# Patient Record
Sex: Female | Born: 1975 | Race: Black or African American | Hispanic: No | Marital: Single | Smoking: Former smoker
Health system: Southern US, Community
[De-identification: ages and names within clinical notes are randomized; demographics above are authoritative.]

## PROBLEM LIST (undated history)

## (undated) DIAGNOSIS — J40 Bronchitis, not specified as acute or chronic: Secondary | ICD-10-CM

## (undated) HISTORY — PX: TUBAL LIGATION: SHX77

---

## 2003-07-19 ENCOUNTER — Emergency Department (HOSPITAL_COMMUNITY): Admission: EM | Admit: 2003-07-19 | Discharge: 2003-07-19 | Payer: Self-pay | Admitting: Emergency Medicine

## 2003-08-08 ENCOUNTER — Ambulatory Visit (HOSPITAL_COMMUNITY): Admission: AD | Admit: 2003-08-08 | Discharge: 2003-08-09 | Payer: Self-pay | Admitting: Obstetrics and Gynecology

## 2003-08-09 ENCOUNTER — Encounter (INDEPENDENT_AMBULATORY_CARE_PROVIDER_SITE_OTHER): Payer: Self-pay | Admitting: *Deleted

## 2003-09-07 ENCOUNTER — Inpatient Hospital Stay (HOSPITAL_COMMUNITY): Admission: AD | Admit: 2003-09-07 | Discharge: 2003-09-07 | Payer: Self-pay | Admitting: Family Medicine

## 2004-04-16 ENCOUNTER — Ambulatory Visit (HOSPITAL_COMMUNITY): Admission: RE | Admit: 2004-04-16 | Discharge: 2004-04-16 | Payer: Self-pay | Admitting: *Deleted

## 2004-04-16 ENCOUNTER — Ambulatory Visit: Payer: Self-pay | Admitting: *Deleted

## 2004-05-06 ENCOUNTER — Ambulatory Visit (HOSPITAL_COMMUNITY): Admission: RE | Admit: 2004-05-06 | Discharge: 2004-05-06 | Payer: Self-pay | Admitting: *Deleted

## 2004-06-26 ENCOUNTER — Inpatient Hospital Stay (HOSPITAL_COMMUNITY): Admission: AD | Admit: 2004-06-26 | Discharge: 2004-06-26 | Payer: Self-pay | Admitting: *Deleted

## 2004-09-15 ENCOUNTER — Inpatient Hospital Stay (HOSPITAL_COMMUNITY): Admission: AD | Admit: 2004-09-15 | Discharge: 2004-09-15 | Payer: Self-pay

## 2004-10-02 ENCOUNTER — Encounter (INDEPENDENT_AMBULATORY_CARE_PROVIDER_SITE_OTHER): Payer: Self-pay | Admitting: *Deleted

## 2004-10-02 ENCOUNTER — Inpatient Hospital Stay (HOSPITAL_COMMUNITY): Admission: AD | Admit: 2004-10-02 | Discharge: 2004-10-04 | Payer: Self-pay | Admitting: Obstetrics and Gynecology

## 2004-10-02 ENCOUNTER — Ambulatory Visit: Payer: Self-pay | Admitting: Obstetrics & Gynecology

## 2005-01-09 ENCOUNTER — Emergency Department (HOSPITAL_COMMUNITY): Admission: EM | Admit: 2005-01-09 | Discharge: 2005-01-09 | Payer: Self-pay | Admitting: Family Medicine

## 2005-02-09 ENCOUNTER — Emergency Department (HOSPITAL_COMMUNITY): Admission: EM | Admit: 2005-02-09 | Discharge: 2005-02-09 | Payer: Self-pay | Admitting: Family Medicine

## 2005-03-23 ENCOUNTER — Inpatient Hospital Stay (HOSPITAL_COMMUNITY): Admission: AD | Admit: 2005-03-23 | Discharge: 2005-03-23 | Payer: Self-pay | Admitting: Family Medicine

## 2005-04-29 ENCOUNTER — Ambulatory Visit (HOSPITAL_COMMUNITY): Admission: RE | Admit: 2005-04-29 | Discharge: 2005-04-29 | Payer: Self-pay | Admitting: *Deleted

## 2005-07-29 ENCOUNTER — Ambulatory Visit: Payer: Self-pay | Admitting: *Deleted

## 2005-07-29 ENCOUNTER — Inpatient Hospital Stay (HOSPITAL_COMMUNITY): Admission: AD | Admit: 2005-07-29 | Discharge: 2005-07-30 | Payer: Self-pay | Admitting: Obstetrics and Gynecology

## 2005-08-08 ENCOUNTER — Ambulatory Visit: Payer: Self-pay | Admitting: *Deleted

## 2005-08-08 ENCOUNTER — Inpatient Hospital Stay (HOSPITAL_COMMUNITY): Admission: AD | Admit: 2005-08-08 | Discharge: 2005-08-10 | Payer: Self-pay | Admitting: *Deleted

## 2005-08-09 ENCOUNTER — Encounter (INDEPENDENT_AMBULATORY_CARE_PROVIDER_SITE_OTHER): Payer: Self-pay | Admitting: *Deleted

## 2005-11-19 ENCOUNTER — Emergency Department (HOSPITAL_COMMUNITY): Admission: EM | Admit: 2005-11-19 | Discharge: 2005-11-20 | Payer: Self-pay | Admitting: Emergency Medicine

## 2005-12-16 ENCOUNTER — Emergency Department (HOSPITAL_COMMUNITY): Admission: EM | Admit: 2005-12-16 | Discharge: 2005-12-16 | Payer: Self-pay | Admitting: Family Medicine

## 2006-01-14 ENCOUNTER — Ambulatory Visit: Payer: Self-pay | Admitting: Obstetrics and Gynecology

## 2006-01-19 ENCOUNTER — Ambulatory Visit (HOSPITAL_COMMUNITY): Admission: RE | Admit: 2006-01-19 | Discharge: 2006-01-19 | Payer: Self-pay | Admitting: Obstetrics and Gynecology

## 2006-04-17 IMAGING — US US OB COMP +14 WK
1 series · 13 of 28 positions shown · non-contrast
Comparison: none

CLINICAL DATA: LMP 01/11/04.  G7 P4 AB2.  Assess fetal anatomy and gestational age.

[Series 1: us ob comp +14 wk · 0.24mm/px · 13 of 58 slices shown]
[im 3/58]
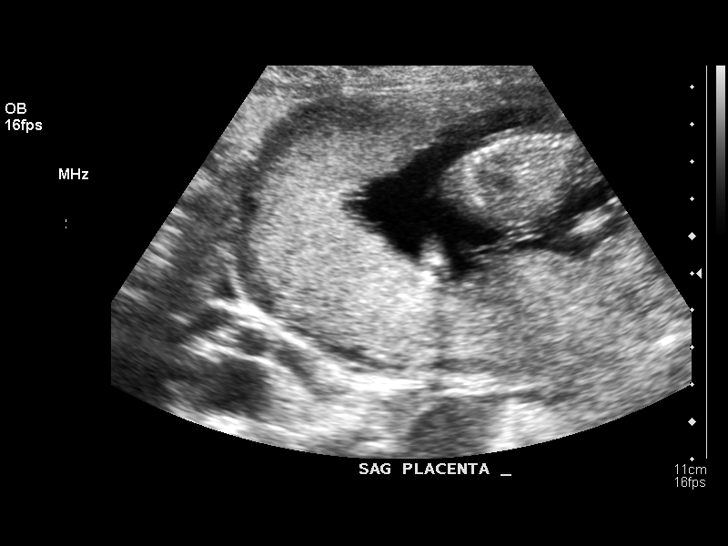
[im 7/58]
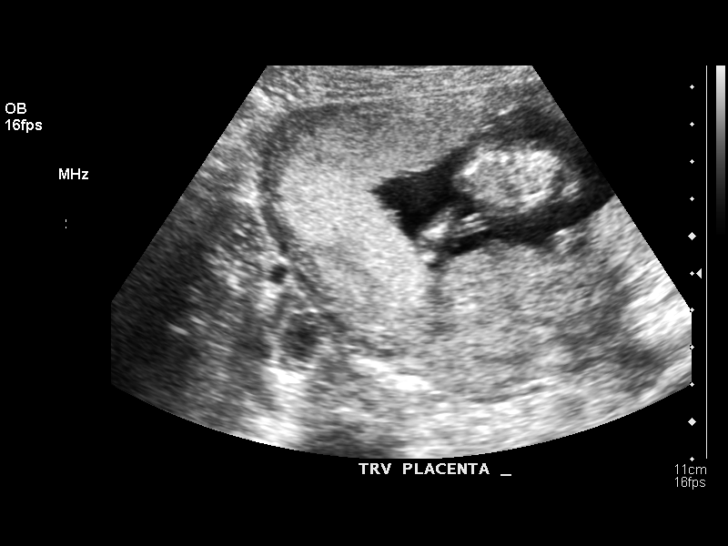
[im 11/58]
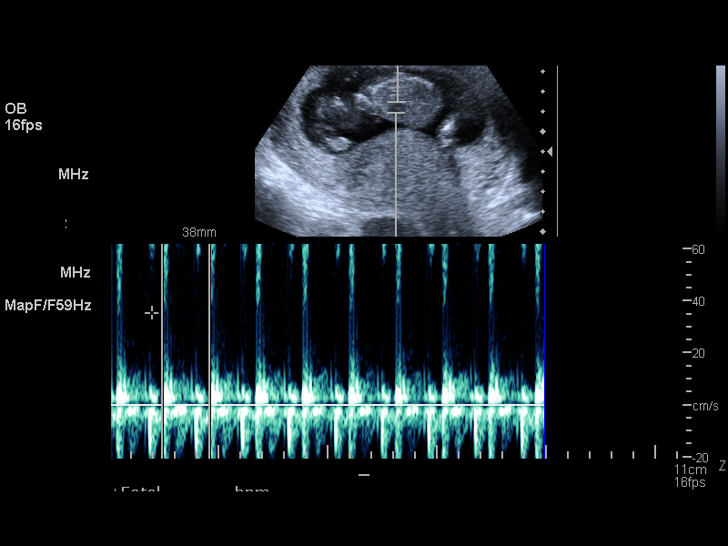
[im 15/58]
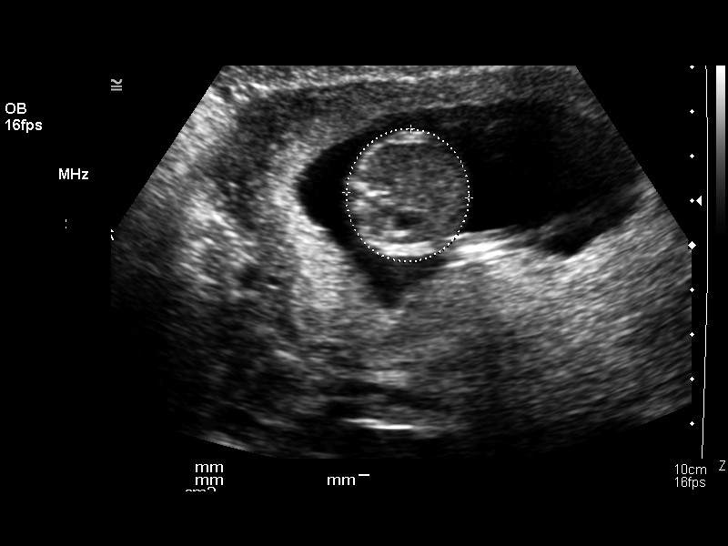
[im 20/58]
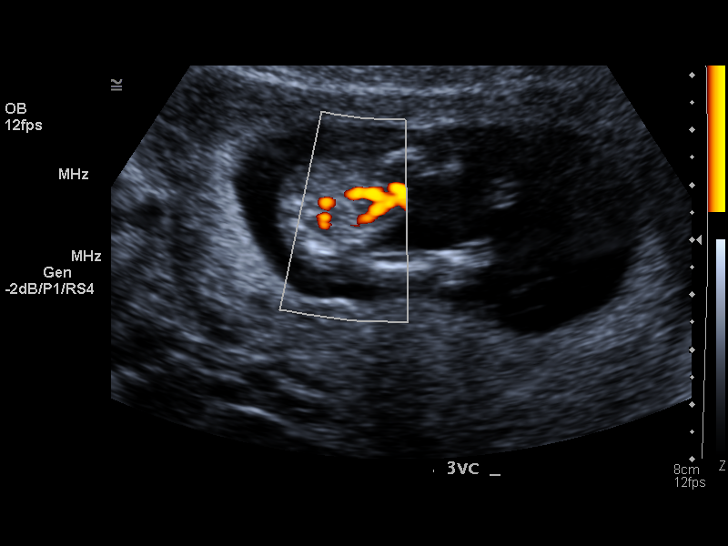
[im 24/58]
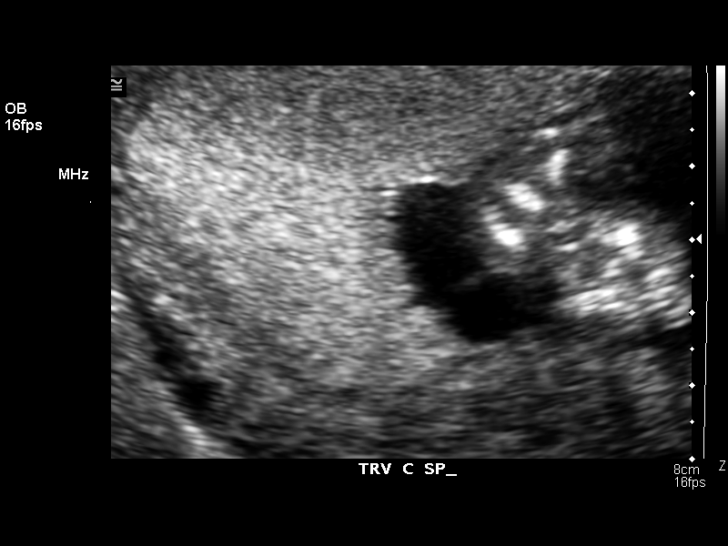
[im 30/58]
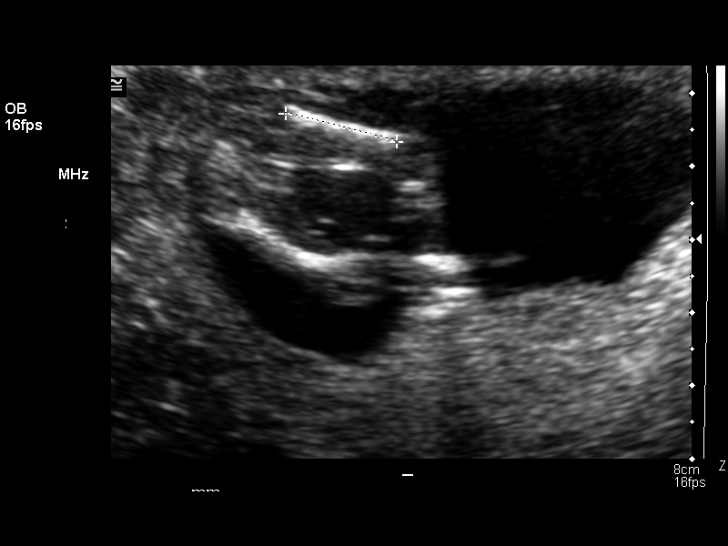
[im 34/58]
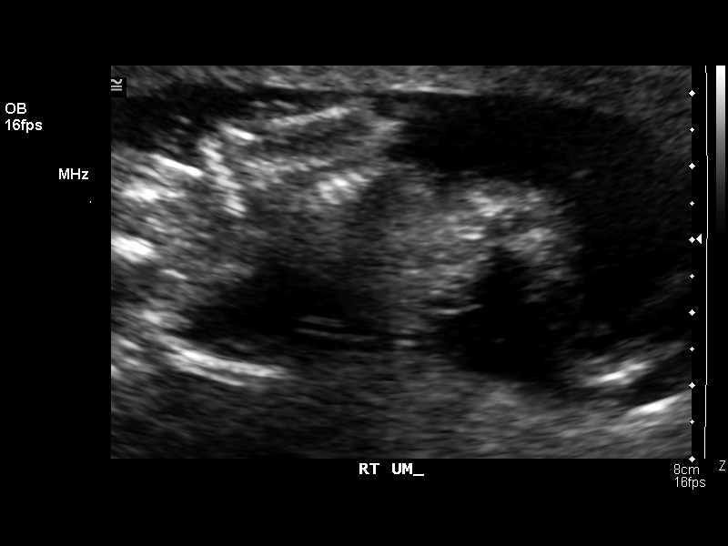
[im 39/58]
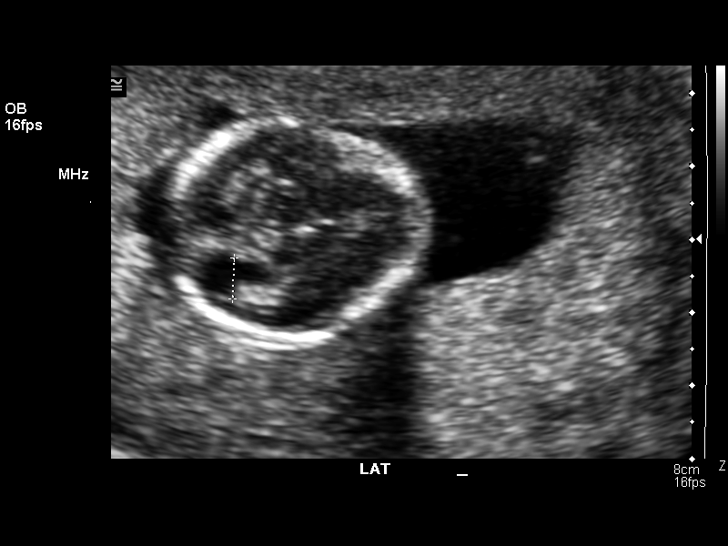
[im 43/58]
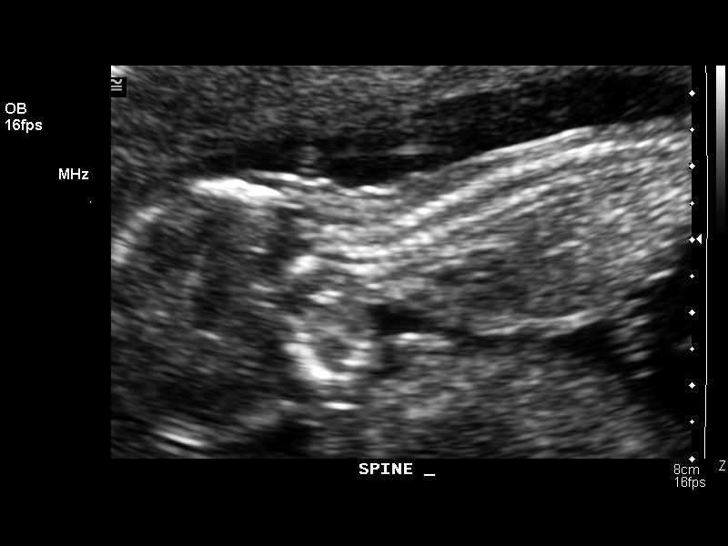
[im 47/58]
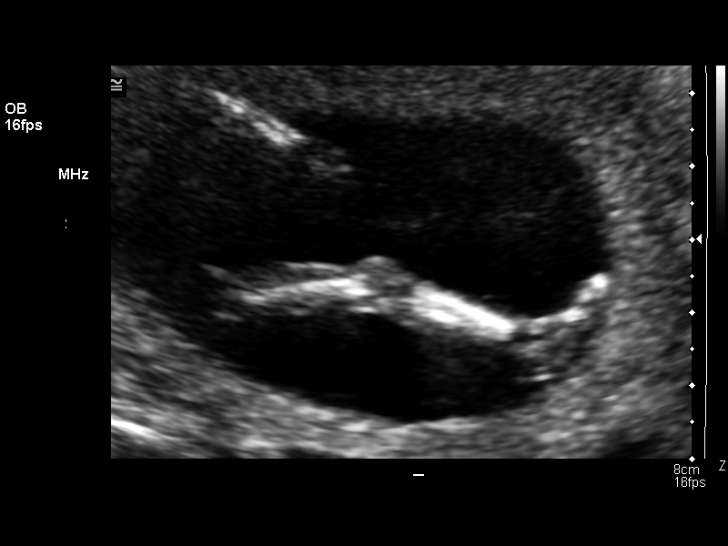
[im 51/58]
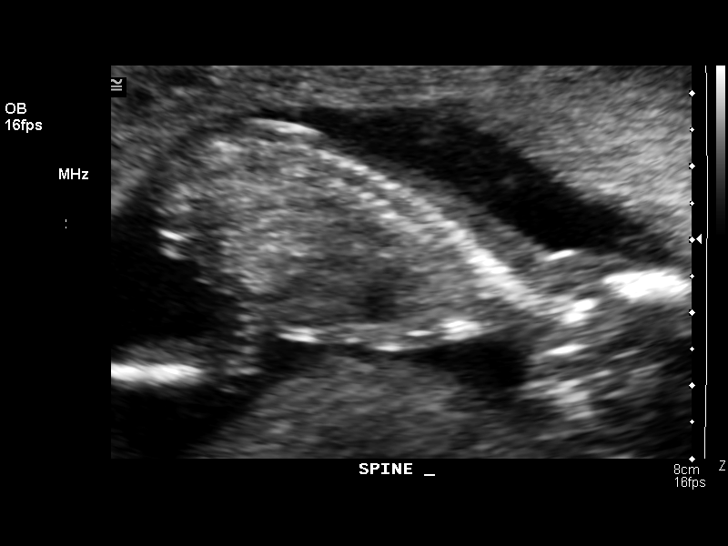
[im 55/58]
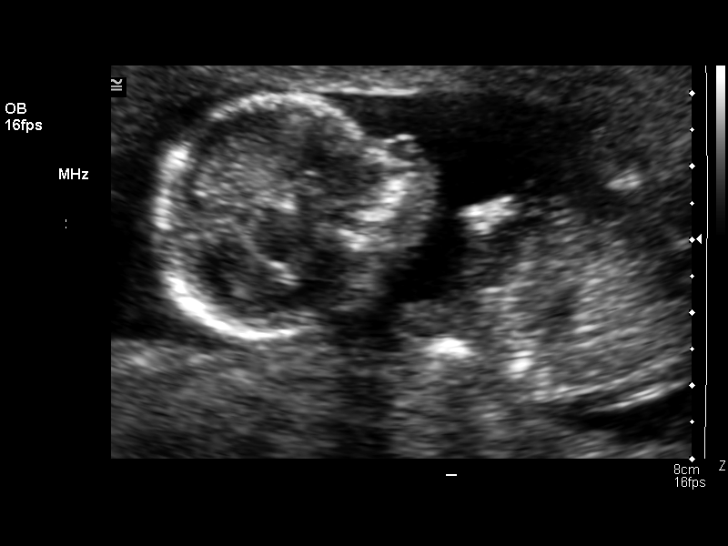

[13 of 28 positions shown; findings below may reference images not displayed]

OBSTETRICAL ULTRASOUND
 Number of Fetuses:  1
 Heart Rate:  139
 Movement:  Yes
 Breathing:    No  
 Presentation:  Variable
 Placental Location:  Posterior
 Grade:  I
 Previa:  No
 Amniotic Fluid (Subjective):  Normal
 Amniotic Fluid (Objective):   4.0 cm Vertical pocket 

 FETAL BIOMETRY
 BPD:   2.9 cm   15 w 2 d
 HC:   11.0 cm   15 w 2 d
 AC:   9.1 cm   15 w 2 d
 FL:   1.6 cm  14 w 4 d

 MEAN GA:  15 w 1 d
 GA BY LMP:  13 w 5 d

 FETAL ANATOMY
 Lateral Ventricles:    Visualized 
 Thalami/CSP:      Visualized 
 Posterior Fossa:  Not visualized 
 Nuchal Region:    Not visualized 
 Spine:      Visualized 
 4 Chamber Heart on Left:      Not visualized 
 Stomach on Left:      Visualized 
 3 Vessel Cord:    Visualized 
 Cord Insertion site:    Visualized 
 Kidneys:  Visualized 
 Bladder:  Visualized 
 Extremities:      Visualized 

 ADDITIONAL ANATOMY VISUALIZED:  Orbits, diaphragm and heel.

 MATERNAL FINDINGS
 Cervix:   3.6 cm Transabdominally
IMPRESSION: Single living intrauterine fetus in variable presentation.  Patient is 13 weeks 5 days by LMP dating and measures 15 weeks 1 day today.  Findings suggest that dates are inaccurate.  Sonographic EDC is 10/07/04.
 Limited anatomic survey due to early gestational age.

 </u12:p>

## 2007-01-11 ENCOUNTER — Emergency Department (HOSPITAL_COMMUNITY): Admission: AD | Admit: 2007-01-11 | Discharge: 2007-01-11 | Payer: Self-pay | Admitting: Emergency Medicine

## 2009-10-07 ENCOUNTER — Emergency Department (HOSPITAL_COMMUNITY): Admission: EM | Admit: 2009-10-07 | Discharge: 2009-10-07 | Payer: Self-pay | Admitting: Family Medicine

## 2009-11-19 ENCOUNTER — Emergency Department (HOSPITAL_COMMUNITY): Admission: EM | Admit: 2009-11-19 | Discharge: 2009-11-19 | Payer: Self-pay | Admitting: Family Medicine

## 2010-11-07 NOTE — Group Therapy Note (Signed)
[name], Mariah Ray NO.:  [id number]   MEDICAL RECORD NO.:  [id number]          PATIENT TYPE:  WOC   LOCATION:  [facility]                   FACILITY:  [facility]   PHYSICIAN:  Argentina Donovan, MD        DATE OF BIRTH:  September 03, 1975   DATE OF SERVICE:  01/14/2006                                    CLINIC NOTE   The patient is a 35 year old, gravida 8, para 6-0-2-6, who delivered her  last baby in February 2007.  During her pregnancy she was told, by  ultrasound, that they saw two large bilateral dermoid cysts, one measuring  7.1 x 4.9 x 4.7, the second 4 x 2 x 3 cm.  The patient is virtually  asymptomatic, but says she wants them out.  She has not been followed up  since then.  We are going to repeat her ultrasound and have her come back in  two weeks to discuss treatment if necessary.   DIAGNOSIS:  Previous cysts on ultrasound, possible dermoids, follow up to be  done.           ______________________________  Argentina Donovan, MD     PR/MEDQ  D:  01/14/2006  T:  01/14/2006  Job:  [id number]

## 2010-11-07 NOTE — Op Note (Signed)
Mariah Ray, Mariah Ray               ACCOUNT NO.:  [id number]   MEDICAL RECORD NO.:  [id number]          PATIENT TYPE:  INP   LOCATION:  9126                          [facility]   PHYSICIAN:  Conni Elliot, M.D.DATE OF BIRTH:  Dec 29, 1975   DATE OF PROCEDURE:  08/09/2005  DATE OF DISCHARGE:                                 OPERATIVE REPORT   PREOPERATIVE DIAGNOSES:  Desires surgical sterilization and grand  multiparity.   POSTOPERATIVE DIAGNOSIS:  Desires surgical sterilization and grand  multiparity.   OPERATION/PROCEDURE:  Modified bilateral Pomeroy tubal ligation.   SURGEON:  Conni Elliot, M.D.   ANESTHESIA:  Spinal.   OPERATIVE FINDINGS:  The uterus and tubes and ovaries were of normal post  gravida state.  It should be noted that upon entering it was noted that  there was a hernia sac underneath the umbilicus.   DESCRIPTION OF PROCEDURE:  After placing the patient in the supine position  left lateral position, and was prepped and draped in the sterile fashion.  Umbilicus incision was made.  __________  fascia and the cavity entered. The  right fallopian tube was identified, grasped with the Babcock clamp,  followed out to the fimbriated end.  Single-tooth tenaculum was brought into  the operative field, double suture ligated and __________ .  Hemostasis was  excellent.  Similar procedure was done on the opposite side.  Hemostasis was  again adequate and anterior peritoneum was closed.  Then we turned to the  hernia sac and put one figure-of-eight two reapproximate the edges of the  fascia that was creating the __________  at the hernial sac.  We then  returned to the closure of the fascial incision.  Fascia was closed.  Subcutaneous tissue and skin were closed with the fascia.  Estimated blood  loss less than 50 mL.           ______________________________  Conni Elliot, M.D.     ASG/MEDQ  D:  08/09/2005  T:  08/10/2005  Job:  [id number]

## 2010-11-07 NOTE — Op Note (Signed)
[name], Mariah Ray                         ACCOUNT NO.:  [id number]   MEDICAL RECORD NO.:  [id number]                   PATIENT TYPE:  MAT   LOCATION:  [facility]                                 [facility]   PHYSICIAN:  Naima A. Dillard, M.D.              DATE OF BIRTH:  02-Sep-1975   DATE OF PROCEDURE:  08/09/2003  DATE OF DISCHARGE:                                 OPERATIVE REPORT   PREOPERATIVE DIAGNOSIS:  Incomplete abortion versus an ectopic with a right  adnexal mass.   POSTOPERATIVE DIAGNOSIS:  Incomplete abortion with a right adnexal mass.   PROCEDURE:  Dilation and evacuation.   SURGEON:  Naima A. Normand Sloop, M.D.   ANESTHESIA:  MAC.   URINE OUTPUT:  About 50 mL.   ESTIMATED BLOOD LOSS:  300 mL.   FINDINGS:  A 10-week size uterus with cervix dilated to 1 cm.  There was a  large amount of bleeding with numerous blood clots in the vagina.  Positive  right adnexal fullness.  The patient went to the recovery room in stable  condition.   PROCEDURE IN DETAIL:  The patient was taken to the operating room, where she  was placed in the dorsal lithotomy position, given MAC anesthesia, and  prepped and draped in a normal sterile fashion.  The bladder was drained of  about 50 mL of urine with a red rubber.  The patient was examined and the  findings noted above were found on exam.  The Graves speculum was placed  into the vagina and large amounts of clot continued to pour out and measured  about 300 mL in a basin of blood.  Once all the clots were removed, full  vaginal exam was within normal limits.  Cervix was without lesions.  The  external and internal os were dilated about a centimeter.  A single-tooth  tenaculum was placed on the anterior lip of the cervix.  A size 9 suction  curettage was placed into the uterine cavity.  It was done until a gritty  texture was noted.  What looked like products of conception was obtained and  sent to pathology for frozen.  Dr. Luisa Hart then  called and told us that it  was indeed products of conception.  Once a gritty texture was noted, the  suction curettage was removed and the sharp curette was placed into the  uterine cavity and curetted until a gritty texture was noted.  The curette  was removed and the suction curette was replaced to remove any blood  products.  A gritty texture was noted and no further products were noted to  be in the uterine cavity.  All instruments were then removed from the  vagina, the tenaculum was removed with hemostasis at the cervix.  The  patient was given 0.2 mg of Methergine.  Bleeding was noted to be minimal.  All instruments were removed from the vagina.  The sponge, lap, and needle  counts were correct x2.  The patient returned to the recovery room in stable  condition.                                               Naima A. Normand Sloop, M.D.   NAD/MEDQ  D:  08/09/2003  T:  08/09/2003  Job:  [id number]

## 2010-11-07 NOTE — Consult Note (Signed)
NAMEASHARIA, LOTTER                         ACCOUNT NO.:  [id number]   MEDICAL RECORD NO.:  [id number]                   PATIENT TYPE:  MAT   LOCATION:  [facility]                                 [facility]   PHYSICIAN:  Janine Limbo, M.D.            DATE OF BIRTH:  [birth year]   DATE OF CONSULTATION:  09/07/2003  DATE OF DISCHARGE:                                   CONSULTATION   EMERGENCY ROOM CONSULTATION NOTE   HISTORY OF PRESENT ILLNESS:  Mariah Ray is a 35 year old female now para 4-0-  2-4 who presents complaining of vaginal bleeding and mild abdominal pain.  The patient was seen in the emergency department on August 08, 2003.  The  [facility] division of [facility] was called  to see this person because we were on-call for the emergency department.  She was noted at that time to have a first trimester missed abortion.  A  dilatation and curettage was performed.  Products of conception were  documented at the time.  The patient was also noted to have a 7.2 cm right  adnexal mass consistent with a dermoid cyst.  The patient's hemoglobin was  noted to be 15.4 at that time.  The patient was instructed to follow up in  the office.  She did not follow up in the office as instructed.  She was  also instructed to take iron tablets.  She did not take iron tablets as  instructed.  She was given the option of being seen at the GYN clinic at the  [facility].  She did not contact the [facility].  She  presents again today complaining that she has had heavy bleeding ever since  her surgery.   OBSTETRICAL HISTORY:  The patient has had four term vaginal deliveries.  She  has now had two first trimester missed abortions.   ALLERGIES:  There are no known drug allergies.   PAST MEDICAL HISTORY:  The patient denies hypertension and diabetes.  She  denies prior GYN surgeries.   SOCIAL HISTORY:  The patient smokes a half a pack of  cigarettes each day.  She drinks alcohol socially.  She denies illegal drug use.   REVIEW OF SYSTEMS:  Noncontributory.   FAMILY HISTORY:  Noncontributory.   PHYSICAL EXAMINATION:  VITAL SIGNS:  Temperature is 98.7, pulse is 103,  respirations 20, blood pressure is 123/78.  HEENT:  Within normal limits.  ABDOMEN:  Soft and nontender.  No masses are appreciated.  PELVIC:  External genitalia is normal.  The vagina is normal.  There is a  very small amount of blood present.  The cervix is closed and nontender.  The uterus is normal size, shape, and consistency.  There is slight  tenderness at the fundus of the uterus.  Adnexa:  There is a 5 cm cystic  mass in the right adnexa.  The  area is slightly tender.   LABORATORY VALUES:  Hemoglobin today is 10.4; white blood cell count is  8400; platelet count is 499,000.   ASSESSMENT:  1. Vaginal bleeding.  2. Probable right dermoid cyst.  3. Status post dilatation and curettage for a missed abortion on August 08, 2003.  4. Noncompliance.  5. Anemia.   PLAN:  We will obtain an ultrasound to document that the dermoid cyst is  stable and also to document that the uterus appears clean.  The patient will  again be encouraged to take iron tablets.  She is encouraged to follow up  either in our office for follow-up visit or to go to the GYN clinic (the  patient reports that she has no money to pay for private GYN care and is  currently awaiting Medicaid).                                               Janine Limbo, M.D.    AVS/MEDQ  D:  09/07/2003  T:  09/08/2003  Job:  [id number]

## 2010-11-07 NOTE — H&P (Signed)
Mariah Ray, Mariah Ray                         ACCOUNT NO.:  [id number]   MEDICAL RECORD NO.:  [id number]                   PATIENT TYPE:  MAT   LOCATION:  [facility]                                 [facility]   PHYSICIAN:  Naima A. Dillard, M.D.              DATE OF BIRTH:  1975-09-27   DATE OF ADMISSION:  08/08/2003  DATE OF DISCHARGE:                                HISTORY & PHYSICAL   CHIEF COMPLAINT:  Early pregnancy, vaginal bleeding.   HISTORY OF PRESENT ILLNESS:  The patient is a 35 year old gravida 6, para  __________ whose last menstrual period was July 12, 2003, which gives her  an estimated gestational age of 12-4/7ths weeks.  The patient presents today  with heavy vaginal bleeding for the last hour.  She started spotting last  P.M.  The patient has not had any prenatal care, but knew she was pregnant  and does not know of any adnexal masses.  The patient does not have any  severe abdominal pain, just some cramping like with her menses.   MEDICATIONS:  None.   ALLERGIES:  There are no known drug allergies.   PAST OBSTETRICAL HISTORY:  The past OB history is significant for SAB times  one and vaginal delivery times four.   PAST GYNECOLOGICAL HISTORY:  The patient denies any sexually transmitted  diseases.   PAST MEDICAL HISTORY:  Medical history is significant for bronchitis.   PAST SURGICAL HISTORY:  The patient denies having any surgeries.   SOCIAL HISTORY:  Social history is significant for tobacco use, a half pack  per day.  She recently moved from [place] back to [place].  She does  drink alcohol, about one beer every other day, and denies any illicit drug  use.   REVIEW OF SYSTEMS:  The review of systems is basically unremarkable, except  for RESPIRATORY with bronchitis and GENITOURINARY as above.   PHYSICAL EXAMINATION:  VITAL SIGNS:  On physical exam the patient's  temperature is 99.9, pulse is 88, respiratory rate is 28 and blood pressure  is  129/91.  GENERAL APPEARANCE:  The patient is in no apparent distress.  NECK:  Neck is supple with free range of motion.  LUNGS: Lungs are clear to auscultation bilaterally.  HEART:  The heart has a regular rate and rhythm.  BREASTS:  Breast exam s deferred.  BACK:  No CVA tenderness.  ABDOMEN:  Abdomen is soft and nontender.  No rebound.  No guarding.  VAGINAL EXAMINATION:  Vulvovaginal exam is within normal limits.  Large  clots seen leaving the vagina.  The patient has large amounts of blood and  is bleeding briskly.  Cervix is fingertip, open.  Uterus is eight to 10  weeks size.  EXTREMITIES:  There are no cyanosis, clubbing or edema.   LABORATORY DATA:  Ultrasound showed a sac in the lower uterine segment and  vagina.  No fetal pole  and also there is a 7.2 cm mass in the right adnexa  that is consistent with a dermoid.  No free fluid in the cul-de-sac.  Her  white count is 8.4, platelets are 296,000 and hemoglobin 16.4.  The patient  is B positive.   ASSESSMENT:  Incomplete abortion with an adnexal mass.   Differential diagnoses inside dermoid, ectopic pregnancy and ovarian cancer.   PLAN:  The plan is to do a dilation and evacuation, and to make sure that  there are villi present; if not we will do a laparoscopy to insure that  there is no ectopic pregnancy present.  If there are villi present we will  just go with a D&E and the patient will follow up in the office to schedule  surgery to remove this dermoid, have a cystectomy and oophorectomy.   The patient understands the risks are, but not limited to, bleeding,  infection, perforation of the uterus,and if she needs a laparoscopy or  laparotomy, damage to internal organs such as bowel, bladder and major blood  vessels.  The patient voice understanding.   I am here on call for the OR.                                               Naima A. Normand Sloop, M.D.    NAD/MEDQ  D:  08/08/2003  T:  08/09/2003  Job:  [id number]

## 2010-11-11 ENCOUNTER — Inpatient Hospital Stay (INDEPENDENT_AMBULATORY_CARE_PROVIDER_SITE_OTHER)
Admission: RE | Admit: 2010-11-11 | Discharge: 2010-11-11 | Disposition: A | Discharge status: Home / Self Care | Payer: Medicaid Other | Source: Ambulatory Visit | Attending: Family Medicine | Admitting: Family Medicine

## 2010-11-11 DIAGNOSIS — J309 Allergic rhinitis, unspecified: Secondary | ICD-10-CM

## 2011-02-15 ENCOUNTER — Emergency Department (HOSPITAL_COMMUNITY): Payer: Medicaid Other

## 2011-02-15 ENCOUNTER — Emergency Department (HOSPITAL_COMMUNITY)
Admission: EM | Admit: 2011-02-15 | Discharge: 2011-02-15 | Disposition: A | Discharge status: Home / Self Care | Payer: Medicaid Other | Attending: Emergency Medicine | Admitting: Emergency Medicine

## 2011-02-15 DIAGNOSIS — IMO0002 Reserved for concepts with insufficient information to code with codable children: Secondary | ICD-10-CM | POA: Insufficient documentation

## 2011-02-15 DIAGNOSIS — S5010XA Contusion of unspecified forearm, initial encounter: Secondary | ICD-10-CM | POA: Insufficient documentation

## 2011-02-15 DIAGNOSIS — M79609 Pain in unspecified limb: Secondary | ICD-10-CM | POA: Insufficient documentation

## 2011-04-06 LAB — POCT URINALYSIS DIP (DEVICE)
Bilirubin Urine: NEGATIVE
Glucose, UA: NEGATIVE
Ketones, ur: NEGATIVE
Nitrite: NEGATIVE
Operator id: 116391
Specific Gravity, Urine: >=1.03
Urobilinogen, UA: 0.2
pH: 5.5

## 2011-04-06 LAB — POCT PREGNANCY, URINE
Operator id: 235561
Preg Test, Ur: NEGATIVE

## 2011-04-21 ENCOUNTER — Encounter: Payer: Self-pay | Admitting: Pulmonary Disease

## 2011-04-21 ENCOUNTER — Ambulatory Visit (INDEPENDENT_AMBULATORY_CARE_PROVIDER_SITE_OTHER): Payer: Medicaid Other | Admitting: Pulmonary Disease

## 2011-04-21 VITALS — BP 122/88 | HR 79 | Temp 98.0°F | Ht 67.0 in | Wt 198.0 lb

## 2011-04-21 DIAGNOSIS — G478 Other sleep disorders: Secondary | ICD-10-CM | POA: Insufficient documentation

## 2011-04-21 DIAGNOSIS — G4733 Obstructive sleep apnea (adult) (pediatric): Secondary | ICD-10-CM

## 2011-04-21 NOTE — Assessment & Plan Note (Signed)
The pt's history is very suggestive of osa.  She has loud snoring, witnessed apneic episodes, choking arousals, and nonrestorative sleep.  She also has significant EDS, and has gained 50 pounds in the last 2 yrs.  I have had a long discussion with the pt about sleep apnea, including its impact on QOL and CV health.  Will arrange for sleep study, and see her back to review.

## 2011-04-21 NOTE — Patient Instructions (Signed)
Will schedule for a sleep study to evaluate for sleep apnea Will arrange followup once the results are available.

## 2011-04-21 NOTE — Progress Notes (Signed)
  Subjective:    Patient ID: Mariah Ray, female    DOB: 01/26/1986, 35 y.o.   MRN: [id number]  HPI The patient is a 35 year old female who I have been asked to see for possible sleep apnea.  She has been having difficulties with "breathing" during sleep, and describes classic choking arousals.  She denies significant reflux symptoms.  Her bed partner has noticed loud snoring, as well as apneic episodes.  The patient admits that she has very restless sleep that is non-restorative.  She will often go back to bed after getting her children to school in order to take a nap.  She notes significant daytime sleepiness with any period of in activity, and will often doze during the day or in the evening while watching television.  She also has some sleepiness with driving.  The patient states that her weight is up 50 pounds over the last 2 years, and her Epworth sleepiness score today is 13.  Sleep Questionnaire: What time do you typically go to bed?( Between what hours) 10:30 pm to 11:00 How long does it take you to fall asleep? hour How many times during the night do you wake up? 3 What time do you get out of bed to start your day? 1100 Do you drive or operate heavy machinery in your occupation? No How much has your weight changed (up or down) over the past two years? (In pounds) 50 lb (22.68 kg) Have you ever had a sleep study before? No Do you currently use CPAP? No Do you wear oxygen at any time? No    Review of Systems  Constitutional: Positive for appetite change. Negative for fever and unexpected weight change.  HENT: Positive for congestion and trouble swallowing. Negative for ear pain, nosebleeds, sore throat, rhinorrhea, sneezing, dental problem, postnasal drip and sinus pressure.   Eyes: Negative for redness and itching.  Respiratory: Positive for shortness of breath. Negative for cough, chest tightness and wheezing.   Cardiovascular: Positive for chest pain. Negative for palpitations and leg  swelling.  Gastrointestinal: Negative for nausea and vomiting.  Genitourinary: Negative for dysuria.  Musculoskeletal: Negative for joint swelling.  Skin: Negative for rash.  Neurological: Negative for headaches.  Hematological: Does not bruise/bleed easily.  Psychiatric/Behavioral: Positive for dysphoric mood. The patient is nervous/anxious.        Objective:   Physical Exam Constitutional:  Overweight female, no acute distress  HENT:  Nares patent without discharge, large turbinates, oozing on right with heme  Oropharynx without exudate, palate and uvula are moderately elongated.   Eyes:  Perrla, eomi, no scleral icterus  Neck:  No JVD, no TMG  Cardiovascular:  Normal rate, regular rhythm, no rubs or gallops.  No murmurs        Intact distal pulses  Pulmonary :  Normal breath sounds, no stridor or respiratory distress   No rales, rhonchi, or wheezing  Abdominal:  Soft, nondistended, bowel sounds present.  No tenderness noted.   Musculoskeletal:  No lower extremity edema noted.  Lymph Nodes:  No cervical lymphadenopathy noted  Skin:  No cyanosis noted  Neurologic:  Alert, appropriate, moves all 4 extremities without obvious deficit.         Assessment & Plan:

## 2011-05-22 ENCOUNTER — Ambulatory Visit (HOSPITAL_BASED_OUTPATIENT_CLINIC_OR_DEPARTMENT_OTHER): Payer: Medicaid Other | Attending: Pulmonary Disease | Admitting: Radiology

## 2011-05-22 VITALS — Ht 66.0 in | Wt 165.0 lb

## 2011-05-22 DIAGNOSIS — G4710 Hypersomnia, unspecified: Secondary | ICD-10-CM | POA: Insufficient documentation

## 2011-05-22 DIAGNOSIS — G4733 Obstructive sleep apnea (adult) (pediatric): Secondary | ICD-10-CM

## 2011-05-30 DIAGNOSIS — G4730 Sleep apnea, unspecified: Secondary | ICD-10-CM

## 2011-05-30 DIAGNOSIS — G4710 Hypersomnia, unspecified: Secondary | ICD-10-CM

## 2011-05-30 NOTE — Procedures (Signed)
Mariah Ray, DARGIS               ACCOUNT NO.:  [id number]  MEDICAL RECORD NO.:  [id number]          PATIENT TYPE:  OUT  LOCATION:  SLEEP CENTER                 FACILITY:  [facility]  PHYSICIAN:  Barbaraann Share, MD,FCCPDATE OF BIRTH:  October 12, 1975  DATE OF STUDY:  05/22/2011                           NOCTURNAL POLYSOMNOGRAM  REFERRING PHYSICIAN:  Barbaraann Share, MD,FCCP  INDICATION FOR STUDY:  Hypersomnia with sleep apnea.  EPWORTH SLEEPINESS SCORE:  8.  MEDICATIONS:  SLEEP ARCHITECTURE:  The patient had a total sleep time of 368 minutes with no slow-wave sleep and only 99 minutes of REM.  Sleep onset latency was normal at 22 minutes, and REM onset was upper limits of normal at 128 minutes.  Sleep efficiency was normal at 91%.  RESPIRATORY DATA:  The patient was found to have no apneas and only 4 obstructive hypopneas, giving her an apnea-hypopnea index of 0.7 events per hour.  The events occurred in all body positions and there was mild to loud snoring noted throughout.  OXYGEN DATA:  There was O2 desaturation as low as 90% with the patient's obstructive events.  CARDIAC DATA:  No clinically significant arrhythmias were noted.  MOVEMENT-PARASOMNIA:  There were no significant leg jerks or other abnormal behavior seen.  IMPRESSIONS-RECOMMENDATIONS:  Small numbers of obstructive events, which do not meet the AHI criteria for the obstructive sleep apnea syndrome. The patient did have very large numbers of nonspecific arousals, and clinical correlation is suggested.  There was no significant O2 desaturation.  The patient should be encouraged to work on modest weight loss.     Barbaraann Share, MD,FCCP Diplomate, American Board of Sleep Medicine Electronically Signed   [facility]/MEDQ  D:  05/30/2011 18:09:57  T:  05/30/2011 22:54:29  Job:  [id number]

## 2011-06-02 ENCOUNTER — Telehealth: Payer: Self-pay | Admitting: *Deleted

## 2011-06-02 NOTE — Telephone Encounter (Signed)
Per kc, Pt needs ov with him to discuss sleep study results.  ATC pt.  No answer and no option to leave message. WCB.

## 2011-06-03 NOTE — Telephone Encounter (Signed)
LMOM for pt to call back.

## 2011-06-03 NOTE — Telephone Encounter (Signed)
Pt returned call. She will see dr clance tomorrow for f/u results. Mariah Ray

## 2011-06-04 ENCOUNTER — Ambulatory Visit (INDEPENDENT_AMBULATORY_CARE_PROVIDER_SITE_OTHER): Payer: Medicaid Other | Admitting: Pulmonary Disease

## 2011-06-04 ENCOUNTER — Encounter: Payer: Self-pay | Admitting: Pulmonary Disease

## 2011-06-04 VITALS — BP 118/70 | HR 94 | Temp 98.5°F | Ht 66.0 in | Wt 194.8 lb

## 2011-06-04 DIAGNOSIS — G478 Other sleep disorders: Secondary | ICD-10-CM

## 2011-06-04 NOTE — Patient Instructions (Signed)
Will try dexilant 60mg  samples one each night before dinner for next 3 weeks to see if breathing improves during sleep.  Please call me in 3 weeks with how things are going.

## 2011-06-04 NOTE — Assessment & Plan Note (Signed)
The patient's sleep study did not show sleep-disordered breathing, and there was no evidence for a primary movement disorder of sleep or abnormal behaviors.  She did have large numbers of nonspecific arousals, but a lot of this may have been due to the sleep study itself.  The patient has had issues with nocturnal reflux, as well as coughing and breathing issues at night.  I wonder if her sleep disruption is due to her reflux issues.  I would like to try her on a course of proton pump inhibitor before dinner, and see how she responds.

## 2011-06-04 NOTE — Progress Notes (Signed)
  Subjective:    Patient ID: Mariah Ray, female    DOB: February 25, 1976, 35 y.o.   MRN: [id number]  HPI Patient comes in today for followup after her recent sleep study.  She was found to have an AHI of less than one per hour, and therefore does not have sleep apnea.  She had no movement disorders or abnormal behaviors, but did have large numbers of nonspecific arousals.  I have reviewed the study with her in detail, and answered all of her questions.   Review of Systems  Constitutional: Negative for fever and unexpected weight change.  HENT: Positive for congestion, sore throat, rhinorrhea, sneezing, trouble swallowing, postnasal drip and sinus pressure. Negative for ear pain, nosebleeds and dental problem.   Eyes: Negative for redness and itching.  Respiratory: Positive for cough, chest tightness, shortness of breath and wheezing.   Cardiovascular: Negative for palpitations and leg swelling.  Gastrointestinal: Negative for nausea and vomiting.  Genitourinary: Negative for dysuria.  Musculoskeletal: Negative for joint swelling.  Skin: Negative for rash.  Neurological: Positive for headaches.  Hematological: Bruises/bleeds easily.  Psychiatric/Behavioral: Positive for dysphoric mood. The patient is nervous/anxious.        Objective:   Physical Exam Well developed female in no acute distress Nose without purulence or discharge noted Lower extremities without edema, no cyanosis noted Work, does not appear to be sleepy, moves all 4 extremities.       Assessment & Plan:

## 2012-08-01 ENCOUNTER — Encounter: Payer: Self-pay | Admitting: Advanced Practice Midwife

## 2012-08-30 ENCOUNTER — Encounter (HOSPITAL_COMMUNITY): Payer: Self-pay | Admitting: *Deleted

## 2012-08-30 ENCOUNTER — Emergency Department (HOSPITAL_COMMUNITY)
Admission: EM | Admit: 2012-08-30 | Discharge: 2012-08-31 | Disposition: A | Discharge status: Home / Self Care | Payer: Medicaid Other | Attending: Emergency Medicine | Admitting: Emergency Medicine

## 2012-08-30 ENCOUNTER — Emergency Department (HOSPITAL_COMMUNITY): Payer: Medicaid Other

## 2012-08-30 DIAGNOSIS — M2550 Pain in unspecified joint: Secondary | ICD-10-CM | POA: Insufficient documentation

## 2012-08-30 DIAGNOSIS — Z8709 Personal history of other diseases of the respiratory system: Secondary | ICD-10-CM | POA: Insufficient documentation

## 2012-08-30 DIAGNOSIS — F17200 Nicotine dependence, unspecified, uncomplicated: Secondary | ICD-10-CM | POA: Insufficient documentation

## 2012-08-30 DIAGNOSIS — R079 Chest pain, unspecified: Secondary | ICD-10-CM | POA: Insufficient documentation

## 2012-08-30 DIAGNOSIS — J069 Acute upper respiratory infection, unspecified: Secondary | ICD-10-CM | POA: Insufficient documentation

## 2012-08-30 HISTORY — DX: Bronchitis, not specified as acute or chronic: J40

## 2012-08-30 MED ORDER — ASPIRIN 81 MG PO CHEW
324.0000 mg | CHEWABLE_TABLET | Freq: Once | ORAL | Status: AC
  Administered 2012-08-30: 324 mg via ORAL
  Filled 2012-08-30: qty 4

## 2012-08-30 NOTE — ED Notes (Signed)
Patient transported to X-ray 

## 2012-08-30 NOTE — ED Notes (Signed)
Pt informed Ladawna PA she had been having diaphoresis and chest pain with left arm pain; NT doing ekg at this time

## 2012-08-30 NOTE — ED Notes (Signed)
Pt c/o cough/bronchitis since yesterday; productive cough; states chest pain from coughing;

## 2012-08-30 NOTE — ED Provider Notes (Signed)
History  This chart was scribed for non-physician practitioner working with Hilario Quarry, MD, by Candelaria Stagers, ED Scribe. This patient was seen in room WTR6/WTR6 and the patient's care was started at 11:10 PM   CSN: [id number]  Arrival date & time 08/30/12  2143   First MD Initiated Contact with Patient 08/30/12 2233      Chief Complaint  Patient presents with  . Cough     The history is provided by the patient. No language interpreter was used.   Mariah Ray is a 37 y.o. female who presents to the Emergency Department complaining of a productive cough that started yesterday.  Pt reports she is experiencing mid intermittent chest pain associated with coughing.   Pt also experienced diaphoresis last night along with left arm tingling and stiffness.  Pt smokes.  Pt has h/o bronchitis and has been prescribed an inhaler which she has ran out of.  She denies ear pain or sore throat.  Pt receives care from [facility].    Past Medical History  Diagnosis Date  . Bronchitis     Past Surgical History  Procedure Laterality Date  . Tubal ligation      Family History  Problem Relation Age of Onset  . Allergies Sister   . Heart disease Paternal Grandmother     History  Substance Use Topics  . Smoking status: Current Every Day Smoker -- 0.25 packs/day for 11 years    Types: Cigarettes    Last Attempt to Quit: 06/22/2010  . Smokeless tobacco: Not on file  . Alcohol Use: No    OB History   Grav Para Term Preterm Abortions TAB SAB Ect Mult Living                  Review of Systems  Constitutional: Negative for fever.  HENT: Negative for ear pain and sore throat.   Respiratory: Positive for cough (productive cough).   Cardiovascular: Positive for chest pain.  Musculoskeletal: Positive for arthralgias (left arm tingling).  All other systems reviewed and are negative.    Allergies  Review of patient's allergies indicates no known allergies.  Home  Medications  No current outpatient prescriptions on file.  BP 142/91  Pulse 87  Temp(Src) 98.7 F (37.1 C) (Oral)  Resp 16  SpO2 100%  LMP 08/09/2012  Physical Exam  Nursing note and vitals reviewed. Constitutional: She is oriented to person, place, and time. She appears well-developed and well-nourished. No distress.  HENT:  Head: Normocephalic and atraumatic.  Eyes: Conjunctivae and EOM are normal.  Neck: Normal range of motion. Neck supple. No tracheal deviation present.  Cardiovascular: Normal rate.   Pulmonary/Chest: Effort normal. No respiratory distress.  Musculoskeletal: Normal range of motion.  Neurological: She is alert and oriented to person, place, and time.  Skin: Skin is warm and dry.  Psychiatric: She has a normal mood and affect. Her behavior is normal.    ED Course  Procedures   DIAGNOSTIC STUDIES: Oxygen Saturation is 100% on room air, normal by my interpretation.    COORDINATION OF CARE:  11:08 PM Discussed course of care with pt which includes moving to the back for work up.  Pt understands and agrees.    Labs Reviewed  BASIC METABOLIC PANEL - Abnormal; Notable for the following:    Glucose, Bld 117 (*)    All other components within normal limits  CBC WITH DIFFERENTIAL  POCT I-STAT TROPONIN I   Dg Chest 2  View  08/30/2012  *RADIOLOGY REPORT*  Clinical Data: Cough  CHEST - 2 VIEW  Comparison: None.  Findings: Upper normal heart size. Slightly prominent central pulmonary arteries. Mediastinal contours normal. No definite infiltrate, pleural effusion or pneumothorax. Bones unremarkable.  IMPRESSION: No definite acute abnormalities.   Original Report Authenticated By: Ulyses Southward, M.D.      1. URI (upper respiratory infection)       MDM    Pt discharge done during downtime. Discussed patient with Dr. Theodoro Kalata - outpatient cardiology referral given. Symptoms have persisted more than 24 hours and has reason for chest pain.  Pt has been advised of  the symptoms that warrant their return to the ED. Patient has voiced understanding and has agreed to follow-up with the PCP or specialist.       Dorthula Matas, PA-C 09/01/12 1638

## 2012-08-31 LAB — CBC WITH DIFFERENTIAL/PLATELET
Basophils Absolute: 0 10*3/uL (ref 0.0–0.1)
Basophils Relative: 0 % (ref 0–1)
Eosinophils Absolute: 0.1 10*3/uL (ref 0.0–0.7)
Eosinophils Relative: 2 % (ref 0–5)
HCT: 42.4 % (ref 36.0–46.0)
Hemoglobin: 14.8 g/dL (ref 12.0–15.0)
Lymphocytes Relative: 31 % (ref 12–46)
Lymphs Abs: 2.1 10*3/uL (ref 0.7–4.0)
MCH: 31.3 pg (ref 26.0–34.0)
MCHC: 34.9 g/dL (ref 30.0–36.0)
MCV: 89.6 fL (ref 78.0–100.0)
Monocytes Absolute: 0.6 10*3/uL (ref 0.1–1.0)
Monocytes Relative: 8 % (ref 3–12)
Neutro Abs: 4 10*3/uL (ref 1.7–7.7)
Neutrophils Relative %: 59 % (ref 43–77)
Platelets: 304 10*3/uL (ref 150–400)
RBC: 4.73 MIL/uL (ref 3.87–5.11)
RDW: 12.7 % (ref 11.5–15.5)
WBC: 6.7 10*3/uL (ref 4.0–10.5)

## 2012-08-31 LAB — BASIC METABOLIC PANEL
BUN: 9 mg/dL (ref 6–23)
CO2: 27 mEq/L (ref 19–32)
Calcium: 8.7 mg/dL (ref 8.4–10.5)
Chloride: 102 mEq/L (ref 96–112)
Creatinine, Ser: 0.63 mg/dL (ref 0.50–1.10)
GFR calc Af Amer: >90 mL/min (ref >90)
GFR calc non Af Amer: >90 mL/min (ref >90)
Glucose, Bld: 117 mg/dL — ABNORMAL HIGH (ref 70–99)
Potassium: 3.6 mEq/L (ref 3.5–5.1)
Sodium: 138 mEq/L (ref 135–145)

## 2012-08-31 MED ORDER — ALBUTEROL SULFATE HFA 108 (90 BASE) MCG/ACT IN AERS
INHALATION_SPRAY | RESPIRATORY_TRACT | Status: AC
  Filled 2012-08-31: qty 6.7

## 2012-09-02 NOTE — ED Provider Notes (Signed)
Medical screening examination/treatment/procedure(s) were performed by non-physician practitioner and as supervising physician I was immediately available for consultation/collaboration.  Brian Opitz, MD 09/02/12 1007 

## 2012-09-29 ENCOUNTER — Institutional Professional Consult (permissible substitution): Payer: Medicaid Other | Admitting: Cardiovascular Disease

## 2012-10-11 ENCOUNTER — Other Ambulatory Visit: Payer: Self-pay

## 2012-10-11 DIAGNOSIS — Z1231 Encounter for screening mammogram for malignant neoplasm of breast: Secondary | ICD-10-CM

## 2012-10-11 DIAGNOSIS — Z803 Family history of malignant neoplasm of breast: Secondary | ICD-10-CM

## 2012-11-11 ENCOUNTER — Ambulatory Visit: Payer: Medicaid Other

## 2012-11-17 ENCOUNTER — Institutional Professional Consult (permissible substitution): Payer: Medicaid Other | Admitting: Cardiovascular Disease

## 2013-01-05 ENCOUNTER — Institutional Professional Consult (permissible substitution): Payer: Medicaid Other | Admitting: Cardiovascular Disease

## 2013-01-31 ENCOUNTER — Encounter: Payer: Self-pay | Admitting: Cardiovascular Disease

## 2013-12-04 ENCOUNTER — Encounter (HOSPITAL_COMMUNITY): Payer: Self-pay | Admitting: Emergency Medicine

## 2013-12-04 ENCOUNTER — Emergency Department (HOSPITAL_COMMUNITY)
Admission: EM | Admit: 2013-12-04 | Discharge: 2013-12-04 | Disposition: A | Discharge status: Home / Self Care | Payer: Medicaid Other | Source: Home / Self Care | Attending: Emergency Medicine | Admitting: Emergency Medicine

## 2013-12-04 DIAGNOSIS — M779 Enthesopathy, unspecified: Secondary | ICD-10-CM

## 2013-12-04 MED ORDER — PREDNISONE 10 MG PO TABS: ORAL_TABLET | ORAL | Status: DC

## 2013-12-04 MED ORDER — MELOXICAM 15 MG PO TABS: 15.0000 mg | ORAL_TABLET | Freq: Every day | ORAL | Status: DC

## 2013-12-04 NOTE — Discharge Instructions (Signed)
Tendinitis °Tendinitis is swelling and inflammation of the tendons. Tendons are band-like tissues that connect muscle to bone. Tendinitis commonly occurs in the:  °· Shoulders (rotator cuff). °· Heels (Achilles tendon). °· Elbows (triceps tendon). °CAUSES °Tendinitis is usually caused by overusing the tendon, muscles, and joints involved. When the tissue surrounding a tendon (synovium) becomes inflamed, it is called tenosynovitis. Tendinitis commonly develops in people whose jobs require repetitive motions. °SYMPTOMS °· Pain. °· Tenderness. °· Mild swelling. °DIAGNOSIS °Tendinitis is usually diagnosed by physical exam. Your caregiver may also order X-rays or other imaging tests. °TREATMENT °Your caregiver may recommend certain medicines or exercises for your treatment. °HOME CARE INSTRUCTIONS  °· Use a sling or splint for as long as directed by your caregiver until the pain decreases. °· Put ice on the injured area. °· Put ice in a plastic bag. °· Place a towel between your skin and the bag. °· Leave the ice on for 15-20 minutes, 03-04 times a day. °· Avoid using the limb while the tendon is painful. Perform gentle range of motion exercises only as directed by your caregiver. Stop exercises if pain or discomfort increase, unless directed otherwise by your caregiver. °· Only take over-the-counter or prescription medicines for pain, discomfort, or fever as directed by your caregiver. °SEEK MEDICAL CARE IF:  °· Your pain and swelling increase. °· You develop new, unexplained symptoms, especially increased numbness in the hands. °MAKE SURE YOU:  °· Understand these instructions. °· Will watch your condition. °· Will get help right away if you are not doing well or get worse. °Document Released: 06/05/2000 Document Revised: 08/31/2011 Document Reviewed: 08/25/2010 °[employer]® Patient Information ©2014 [employer]. ° °

## 2013-12-04 NOTE — ED Provider Notes (Signed)
Chief Complaint   Chief Complaint  Patient presents with  . Hand Pain    History of Present Illness   Mariah Ray is a 38 year old female. She was playing fighting with her nephew 2 weeks ago. She struck him with her left hand, forcefully flexing the index and middle fingers of her left hand. Ever since then she's had pain over the dorsum of both of these fingers extending to the dorsum of the hand, and also with radiation up the arm toward the shoulder. This is worse if she flexes the fingers and she has a sensation of numbness and tingling. There is no muscle weakness. No swelling. No limited range of motion. No obvious deformity. About a week ago she noted the same thing happening in the right hand with pain with flexion, radiation up the arm, numbness, and tingling. She did not injure the right hand. She denies any neck pain in her neck has a full range of motion with no pain. She denies any other joint pain.  Review of Systems   Other than as noted above, the patient denies any of the following symptoms: Systemic:  No fevers or chills. Musculoskeletal:  No joint pain or arthritis.  Neurological:  No muscular weakness or paresthesias.  PMFSH   Past medical history, family history, social history, meds, and allergies were reviewed.     Physical Examination   Vital signs:  BP 143/90  Pulse 82  Temp(Src) 98.1 F (36.7 C) (Oral)  Resp 16  SpO2 99% Gen:  Alert and oriented times 3.  In no distress. Musculoskeletal:  Exam of the hand reveals no swelling, bruising, or deformity. All joints have a full range of motion. There was no pain to palpation. Tinel's and Phalen's signs were negative. Pulses were full. She has good capillary refill, normal sensation.  Otherwise, all joints had a full a ROM with no swelling, bruising or deformity.  No edema, pulses full. Extremities were warm and pink.  Capillary refill was brisk.  Skin:  Clear, warm and dry.  No rash. Neuro:  Alert and  oriented times 3.  Muscle strength was normal.  Sensation was intact to light touch.   Assessment   The encounter diagnosis was Tendonitis.  I think she's got some tendinitis in her left hand possibly due to the injury. The tendinitis in the right hand has nothing to do with the injuries and she didn't injure that hand. It may just be due to overuse.  Plan  1.  Meds:  The following meds were prescribed:   Discharge Medication List as of 12/04/2013  3:04 PM    START taking these medications   Details  meloxicam (MOBIC) 15 MG tablet Take 1 tablet (15 mg total) by mouth daily., Starting 12/04/2013, Until Discontinued, Normal    predniSONE (DELTASONE) 10 MG tablet Take 4 tabs daily for 4 days, 3 tabs daily for 4 days, 2 tabs daily for 4 days, then 1 tab daily for 4 days., Normal        2.  Patient Education/Counseling:  The patient was given appropriate handouts, self care instructions, and instructed in symptomatic relief, including rest and activity, and elevation. Suggested rest of the hands, try not to injure them again, followup with a hand specialist if no better in 2 weeks.  3.  Follow up:  The patient was told to follow up here if no better in 3 to 4 days, or sooner if becoming worse in any way, and given  some red flag symptoms such as worsening pain, fever, swelling, or neurological symptoms which would prompt immediate return.        Reuben Likesavid C Madesyn Ast, MD 12/04/13 [phone]

## 2013-12-04 NOTE — ED Notes (Signed)
Reports pain in her hand/finger for 1 week since "sparring" w her nephew . Denies other injury

## 2014-01-03 ENCOUNTER — Encounter (HOSPITAL_COMMUNITY): Payer: Self-pay | Admitting: Emergency Medicine

## 2014-01-03 ENCOUNTER — Emergency Department (HOSPITAL_COMMUNITY)
Admission: EM | Admit: 2014-01-03 | Discharge: 2014-01-04 | Disposition: A | Discharge status: Home / Self Care | Payer: Medicaid Other | Attending: Emergency Medicine | Admitting: Emergency Medicine

## 2014-01-03 DIAGNOSIS — N949 Unspecified condition associated with female genital organs and menstrual cycle: Secondary | ICD-10-CM | POA: Insufficient documentation

## 2014-01-03 DIAGNOSIS — A599 Trichomoniasis, unspecified: Secondary | ICD-10-CM

## 2014-01-03 DIAGNOSIS — R109 Unspecified abdominal pain: Secondary | ICD-10-CM | POA: Diagnosis present

## 2014-01-03 DIAGNOSIS — N898 Other specified noninflammatory disorders of vagina: Secondary | ICD-10-CM | POA: Insufficient documentation

## 2014-01-03 DIAGNOSIS — N939 Abnormal uterine and vaginal bleeding, unspecified: Secondary | ICD-10-CM

## 2014-01-03 DIAGNOSIS — Z8709 Personal history of other diseases of the respiratory system: Secondary | ICD-10-CM | POA: Insufficient documentation

## 2014-01-03 DIAGNOSIS — A5901 Trichomonal vulvovaginitis: Secondary | ICD-10-CM | POA: Diagnosis not present

## 2014-01-03 DIAGNOSIS — Z3202 Encounter for pregnancy test, result negative: Secondary | ICD-10-CM | POA: Diagnosis not present

## 2014-01-03 DIAGNOSIS — F17200 Nicotine dependence, unspecified, uncomplicated: Secondary | ICD-10-CM | POA: Diagnosis not present

## 2014-01-03 LAB — URINALYSIS, ROUTINE W REFLEX MICROSCOPIC
BILIRUBIN URINE: NEGATIVE
Glucose, UA: NEGATIVE mg/dL
KETONES UR: NEGATIVE mg/dL
Leukocytes, UA: NEGATIVE
NITRITE: NEGATIVE
Protein, ur: NEGATIVE mg/dL
SPECIFIC GRAVITY, URINE: 1.002 — AB (ref 1.005–1.030)
UROBILINOGEN UA: 1 mg/dL (ref 0.0–1.0)
pH: 7 (ref 5.0–8.0)

## 2014-01-03 LAB — URINE MICROSCOPIC-ADD ON

## 2014-01-03 LAB — PREGNANCY, URINE: Preg Test, Ur: NEGATIVE

## 2014-01-03 NOTE — ED Notes (Signed)
Pt states she is having back pain and abd pain and has been voiding back to back and is having some spotting  Pt states she has already had her period for the month

## 2014-01-04 ENCOUNTER — Emergency Department (HOSPITAL_COMMUNITY): Payer: Medicaid Other

## 2014-01-04 LAB — CBC WITH DIFFERENTIAL/PLATELET
Basophils Absolute: 0 10*3/uL (ref 0.0–0.1)
Basophils Relative: 1 % (ref 0–1)
EOS ABS: 0.1 10*3/uL (ref 0.0–0.7)
EOS PCT: 2 % (ref 0–5)
HCT: 43.9 % (ref 36.0–46.0)
HEMOGLOBIN: 15.4 g/dL — AB (ref 12.0–15.0)
LYMPHS ABS: 2.4 10*3/uL (ref 0.7–4.0)
Lymphocytes Relative: 36 % (ref 12–46)
MCH: 31.4 pg (ref 26.0–34.0)
MCHC: 35.1 g/dL (ref 30.0–36.0)
MCV: 89.6 fL (ref 78.0–100.0)
MONO ABS: 0.5 10*3/uL (ref 0.1–1.0)
MONOS PCT: 8 % (ref 3–12)
Neutro Abs: 3.6 10*3/uL (ref 1.7–7.7)
Neutrophils Relative %: 53 % (ref 43–77)
Platelets: 251 10*3/uL (ref 150–400)
RBC: 4.9 MIL/uL (ref 3.87–5.11)
RDW: 12.2 % (ref 11.5–15.5)
WBC: 6.6 10*3/uL (ref 4.0–10.5)

## 2014-01-04 LAB — COMPREHENSIVE METABOLIC PANEL
ALK PHOS: 58 U/L (ref 39–117)
ALT: 17 U/L (ref 0–35)
ANION GAP: 15 (ref 5–15)
AST: 14 U/L (ref 0–37)
Albumin: 4.1 g/dL (ref 3.5–5.2)
BUN: 6 mg/dL (ref 6–23)
CALCIUM: 9.6 mg/dL (ref 8.4–10.5)
CO2: 22 mEq/L (ref 19–32)
Chloride: 101 mEq/L (ref 96–112)
Creatinine, Ser: 0.51 mg/dL (ref 0.50–1.10)
GFR calc non Af Amer: >90 mL/min (ref >90)
GLUCOSE: 107 mg/dL — AB (ref 70–99)
Potassium: 3.7 mEq/L (ref 3.7–5.3)
Sodium: 138 mEq/L (ref 137–147)
TOTAL PROTEIN: 8.2 g/dL (ref 6.0–8.3)
Total Bilirubin: 0.3 mg/dL (ref 0.3–1.2)

## 2014-01-04 LAB — WET PREP, GENITAL
CLUE CELLS WET PREP: NONE SEEN
Yeast Wet Prep HPF POC: NONE SEEN

## 2014-01-04 LAB — GC/CHLAMYDIA PROBE AMP
CT Probe RNA: NEGATIVE
GC Probe RNA: NEGATIVE

## 2014-01-04 MED ORDER — TRAMADOL HCL 50 MG PO TABS: 50.0000 mg | ORAL_TABLET | Freq: Four times a day (QID) | ORAL | Status: DC | PRN

## 2014-01-04 MED ORDER — IBUPROFEN 600 MG PO TABS: 600.0000 mg | ORAL_TABLET | Freq: Four times a day (QID) | ORAL | Status: DC | PRN

## 2014-01-04 MED ORDER — METRONIDAZOLE 500 MG PO TABS
2000.0000 mg | ORAL_TABLET | Freq: Once | ORAL | Status: AC
  Administered 2014-01-04: 2000 mg via ORAL
  Filled 2014-01-04: qty 4

## 2014-01-04 NOTE — ED Provider Notes (Signed)
CSN: [id number]     Arrival date & time 01/03/14  2109 History   First MD Initiated Contact with Patient 01/03/14 2340     Chief Complaint  Patient presents with  . Abdominal Pain  . Back Pain     (Consider location/radiation/quality/duration/timing/severity/associated sxs/prior Treatment) HPI Comments: Patient presents with complaint of lower abdominal pain for the past one week. Pain began after sexual intercourse. She has not had any fevers, nausea, vomiting, diarrhea. Patient has chronic constipation which is unchanged. No blood in her stool. No treatments prior to arrival. Patient has had white vaginal discharge.  Patient had one episode of spotting after wiping. No urinary symptoms such as dysuria or hematuria. The onset of this condition was acute. The course is constant. Aggravating factors: none. Alleviating factors: none. She has no concerns of safety at home. She has one sexual partner and is not concerned about sexually transmitted infection.    The history is provided by the patient.    Past Medical History  Diagnosis Date  . Bronchitis    Past Surgical History  Procedure Laterality Date  . Tubal ligation     Family History  Problem Relation Age of Onset  . Allergies Sister   . Heart disease Paternal Grandmother    History  Substance Use Topics  . Smoking status: Current Every Day Smoker -- 0.25 packs/day for 11 years    Types: Cigarettes    Last Attempt to Quit: 06/22/2010  . Smokeless tobacco: Not on file  . Alcohol Use: No   OB History   Grav Para Term Preterm Abortions TAB SAB Ect Mult Living                 Review of Systems  Constitutional: Negative for fever.  HENT: Negative for rhinorrhea and sore throat.   Eyes: Negative for redness.  Respiratory: Negative for cough.   Cardiovascular: Negative for chest pain.  Gastrointestinal: Positive for abdominal pain. Negative for nausea, vomiting and diarrhea.  Genitourinary: Positive for vaginal bleeding  (spotting), vaginal discharge and pelvic pain. Negative for dysuria and flank pain.  Musculoskeletal: Negative for myalgias.  Skin: Negative for rash.  Neurological: Negative for headaches.    Allergies  Review of patient's allergies indicates no known allergies.  Home Medications   Prior to Admission medications   Not on File   BP 169/96  Pulse 96  Temp(Src) 98.7 F (37.1 C) (Oral)  Resp 16  SpO2 100%  LMP 12/13/2013  Physical Exam  Nursing note and vitals reviewed. Constitutional: She appears well-developed and well-nourished.  HENT:  Head: Normocephalic and atraumatic.  Eyes: Conjunctivae are normal. Right eye exhibits no discharge. Left eye exhibits no discharge.  Neck: Normal range of motion. Neck supple.  Cardiovascular: Normal rate, regular rhythm and normal heart sounds.   Pulmonary/Chest: Effort normal and breath sounds normal.  Abdominal: Soft. There is no tenderness. There is no rebound and no guarding.  Genitourinary: There is no rash, tenderness, lesion or injury on the right labia. There is no rash, tenderness, lesion or injury on the left labia. Uterus is not deviated, not enlarged, not fixed and not tender. Cervix exhibits no motion tenderness, no discharge and no friability. Right adnexum displays no mass, no tenderness and no fullness. Left adnexum displays no mass, no tenderness and no fullness. There is tenderness (mild) and bleeding (small amount of maroon blood) around the vagina. No erythema around the vagina. No foreign body around the vagina. No signs of injury around  the vagina. No vaginal discharge found.  Neurological: She is alert.  Skin: Skin is warm and dry.  Psychiatric: She has a normal mood and affect.    ED Course  Procedures (including critical care time) Labs Review Labs Reviewed  WET PREP, GENITAL - Abnormal; Notable for the following:    Trich, Wet Prep MANY (*)    WBC, Wet Prep HPF POC FEW (*)    All other components within normal  limits  URINALYSIS, ROUTINE W REFLEX MICROSCOPIC - Abnormal; Notable for the following:    Specific Gravity, Urine 1.002 (*)    Hgb urine dipstick MODERATE (*)    All other components within normal limits  CBC WITH DIFFERENTIAL - Abnormal; Notable for the following:    Hemoglobin 15.4 (*)    All other components within normal limits  COMPREHENSIVE METABOLIC PANEL - Abnormal; Notable for the following:    Glucose, Bld 107 (*)    All other components within normal limits  GC/CHLAMYDIA PROBE AMP  PREGNANCY, URINE  URINE MICROSCOPIC-ADD ON    Imaging Review Dg Abd 2 Views  01/04/2014   CLINICAL DATA:  Abdominal pain and constipation.  EXAM: ABDOMEN - 2 VIEW  COMPARISON:  Abdominal radiograph Nov 19, 2009  FINDINGS: The bowel gas pattern is normal. Mild amount of stool projecting at the cecum. There is no evidence of free air. Phleboliths in the pelvis. No suspicious radio-opaque calculi or other significant radiographic abnormality is seen.  IMPRESSION: Mild amount of retained large bowel stool, nonobstructive bowel gas pattern.   Electronically Signed   By: Awilda Metroourtnay  Bloomer   On: 01/04/2014 01:03     EKG Interpretation None      12:39 AM Patient seen and examined. Work-up initiated. Medications ordered.   Vital signs reviewed and are as follows: Filed Vitals:   01/03/14 2338  BP: 169/96  Pulse: 96  Temp:   Resp: 16   Pelvic exam performed with nurse chaperone.   Pt informed of all results. Metronidazole given for trichomonas. Discussed need for partner to be tested and treated.   Patient has GYN. She needs to follow-up for Pap smear. Patient instructed to followup in the next week for recheck of possible injury.  The patient was urged to return to the Emergency Department immediately with worsening of current symptoms, worsening abdominal pain, persistent vomiting, blood noted in stools, fever, or any other concerns. The patient verbalized understanding.   MDM   Final  diagnoses:  Vaginal bleeding  Trichomoniasis   Vaginal bleeding: No obvious injury on exam. No lacerations noted -- cannot rule out small tear but if there is one present, with small amount of bleeding over course of week, it is likely clinically insignificant. Patient with mild bleeding and tenderness. No anemia or brisk bleeding noted. Do not suspect clinically significant laceration or hematoma after one week of symptoms with minimal tenderness. Pain control and no sexual intercourse until follow-up with GYN for re-check.   Trich: Metronidazole in ED. Patient refuses testing and treatment of other STI. She does not feel that she is at risk for these.   No dangerous or life-threatening conditions suspected or identified by history, physical exam, and by work-up. No indications for hospitalization identified.    Renne CriglerJoshua Yadira Hada, PA-C 01/04/14 [phone]

## 2014-01-04 NOTE — Discharge Instructions (Signed)
Please read and follow all provided instructions.  Your diagnoses today include:  1. Vaginal bleeding   2. Trichomoniasis    Tests performed today include:  Blood counts and electrolytes - normal  Blood tests to check liver and kidney function - normal  Urine test to look for infection and pregnancy - no infection  Wet prep - shows trichomonas infection, no yeast infection  X-ray of abdomen - shows mild constipation  Vital signs. See below for your results today.   Medications prescribed:   Tramadol - narcotic-like pain medication  DO NOT drive or perform any activities that require you to be awake and alert because this medicine can make you drowsy.    Ibuprofen (Motrin, Advil) - anti-inflammatory pain medication  Do not exceed 600mg  ibuprofen every 6 hours, take with food  You have been prescribed an anti-inflammatory medication or NSAID. Take with food. Take smallest effective dose for the shortest duration needed for your pain. Stop taking if you experience stomach pain or vomiting.   Take any prescribed medications only as directed.  Home care instructions:   Follow any educational materials contained in this packet.  Follow-up instructions: Please follow-up with your primary care provider in the next 3 days for further evaluation of your symptoms.    Return instructions:  SEEK IMMEDIATE MEDICAL ATTENTION IF:  The pain does not go away or becomes severe   You develop worsening or heavy vaginal bleeding  You feel lightheaded or pass out  A temperature above 101F develops   Repeated vomiting occurs (multiple episodes)   The pain becomes localized to portions of the abdomen. The right side could possibly be appendicitis. In an adult, the left lower portion of the abdomen could be colitis or diverticulitis.   Blood is being passed in stools or vomit (bright red or black tarry stools)   You develop chest pain, difficulty breathing, dizziness or fainting, or  become confused, poorly responsive, or inconsolable (young children)  If you have any other emergent concerns regarding your health  Additional Information: Abdominal (belly) pain can be caused by many things. Your caregiver performed an examination and possibly ordered blood/urine tests and imaging (CT scan, x-rays, ultrasound). Many cases can be observed and treated at home after initial evaluation in the emergency department. Even though you are being discharged home, abdominal pain can be unpredictable. Therefore, you need a repeated exam if your pain does not resolve, returns, or worsens. Most patients with abdominal pain don't have to be admitted to the hospital or have surgery, but serious problems like appendicitis and gallbladder attacks can start out as nonspecific pain. Many abdominal conditions cannot be diagnosed in one visit, so follow-up evaluations are very important.  Your vital signs today were: BP 169/96   Pulse 96   Temp(Src) 98.7 F (37.1 C) (Oral)   Resp 16   SpO2 100%   LMP 12/13/2013 If your blood pressure (bp) was elevated above 135/85 this visit, please have this repeated by your doctor within one month. --------------

## 2014-01-04 NOTE — ED Notes (Signed)
Pt out of room for x-ray 

## 2014-01-05 NOTE — ED Provider Notes (Signed)
Medical screening examination/treatment/procedure(s) were performed by non-physician practitioner and as supervising physician I was immediately available for consultation/collaboration.    Hakiem Malizia D Larie Mathes, MD 01/05/14 0217 

## 2014-02-05 ENCOUNTER — Emergency Department (INDEPENDENT_AMBULATORY_CARE_PROVIDER_SITE_OTHER): Payer: Medicaid Other

## 2014-02-05 ENCOUNTER — Emergency Department (HOSPITAL_COMMUNITY)
Admission: EM | Admit: 2014-02-05 | Discharge: 2014-02-05 | Disposition: A | Discharge status: Home / Self Care | Payer: Medicaid Other | Source: Home / Self Care | Attending: Emergency Medicine | Admitting: Emergency Medicine

## 2014-02-05 ENCOUNTER — Encounter (HOSPITAL_COMMUNITY): Payer: Self-pay | Admitting: Emergency Medicine

## 2014-02-05 DIAGNOSIS — R079 Chest pain, unspecified: Secondary | ICD-10-CM

## 2014-02-05 LAB — D-DIMER, QUANTITATIVE (NOT AT ARMC)

## 2014-02-05 MED ORDER — IBUPROFEN 800 MG PO TABS
800.0000 mg | ORAL_TABLET | Freq: Once | ORAL | Status: AC
  Administered 2014-02-05: 800 mg via ORAL

## 2014-02-05 MED ORDER — MELOXICAM 15 MG PO TABS: 15.0000 mg | ORAL_TABLET | Freq: Every day | ORAL | Status: DC

## 2014-02-05 MED ORDER — METHOCARBAMOL 500 MG PO TABS: 500.0000 mg | ORAL_TABLET | Freq: Three times a day (TID) | ORAL | Status: DC

## 2014-02-05 MED ORDER — IBUPROFEN 800 MG PO TABS
ORAL_TABLET | ORAL | Status: AC
  Filled 2014-02-05: qty 1

## 2014-02-05 NOTE — ED Notes (Signed)
Chest pain, intermittent pain in left chest for 2 days.  No diaphoresis, no nausea, no vomiting.  History of the same and told it was stress. Patient does have a lot of stress currently.  39 year old son has gone to jail.  Patient does not relate pain to a particular behavior

## 2014-02-05 NOTE — ED Provider Notes (Signed)
Chief Complaint   Chief Complaint  Patient presents with  . Chest Pain    History of Present Illness    STUART GUILLEN is a 38 year old female who has had a history since yesterday of pain along the upper left sternal border. The pain comes and goes and comes and lasts for a few minutes at a time. It's an ache rated a 4-5/10 in intensity. The patient was cleaning her house when the pain came on. She has also been under lots of stress recently. The pain does not radiate. It's not associated with meals, activity, or exertion. She denies any associated nausea, diaphoresis, or shortness of breath. She's had no fever, chills, URI symptoms, coughing, wheezing, palpitations, dizziness, syncope, abdominal pain, vomiting, or diarrhea. She has no history of cardiac disease, DVT, phlebitis, or blood clots. She denies any leg pain or swelling. The pain is nonpleuritic. She's had no history of high blood pressure, diabetes, hypercholesterolemia, or family history of heart disease. She is a cigarette smoker.  Review of Systems    Other than noted above, the patient denies any of the following symptoms. Systemic:  No fever or chills. Pulmonary:  No cough, wheezing, shortness of breath, sputum production, hemoptysis. Cardiac:  No palpitations, rapid heartbeat, dizziness, presyncope or syncope. GI:  No abdominal pain, heartburn, nausea, or vomiting. Ext:  No leg pain or swelling.  PMFSH    Past medical history, family history, social history, meds, and allergies were reviewed.   Physical Exam     Vital signs:  BP 154/88  Pulse 91  Temp(Src) 99.4 F (37.4 C) (Oral)  Resp 18  SpO2 98%  LMP 02/02/2014 Gen:  Alert, oriented, in no distress, skin warm and dry. Eye:  PERRL, lids and conjunctivas normal.  Sclera non-icteric. ENT:  Mucous membranes moist, pharynx clear. Neck:  Supple, no adenopathy or tenderness.  No JVD. Lungs:  Clear to auscultation, no wheezes, rales or rhonchi.  No respiratory  distress. Heart:  Regular rhythm.  No gallops, murmers, clicks or rubs. Chest:  No chest wall tenderness. Abdomen:  Soft, nontender, no organomegaly or mass.  Bowel sounds normal.  No pulsatile abdominal mass or bruit. Ext:  No edema.  No calf tenderness and Homann's sign negative.  Pulses full and equal. Skin:  Warm and dry.  No rash.  Labs     Results for orders placed during the hospital encounter of 02/05/14  D-DIMER, QUANTITATIVE      Result Value Ref Range   D-Dimer, Quant <0.27  0.00 - 0.48 ug/mL-FEU     Radiology     Dg Chest 2 View  02/05/2014   CLINICAL DATA:  Left-sided chest pain for 2 days.  EXAM: CHEST  2 VIEW  COMPARISON:  08/30/2012.  FINDINGS: The heart size and mediastinal contours are within normal limits. Both lungs are clear. The visualized skeletal structures are unremarkable.  IMPRESSION: No active cardiopulmonary disease.   Electronically Signed   By: Andreas Newport M.D.   On: 02/05/2014 14:31   I reviewed the images independently and personally and concur with the radiologist's findings.  EKG Results:  Date: 02/05/2014  Rate: 94  Rhythm: normal sinus rhythm  QRS Axis: left: -25  Intervals: normal  ST/T Wave abnormalities: normal  Conduction Disutrbances:nonspecific intraventricular conduction delay  Narrative Interpretation: Normal sinus rhythm, leftward axis, borderline criteria for LVH, short PR interval, RSR in V1, no significant change since last tracing.  Old EKG Reviewed: unchanged  Assessment     The encounter diagnosis was Chest pain, unspecified chest pain type.  No evidence of acute coronary syndrome, pulmonary embolism, ruptured aneurysm, pneumothorax, Boerhaave syndrome, pericarditis, musculoskeletal pain, or reflux esophagitis.   Plan     1.  Meds:  The following meds were prescribed:   Discharge Medication  List as of 02/05/2014  3:43 PM    START taking these medications   Details  meloxicam (MOBIC) 15 MG tablet Take 1 tablet (15 mg total) by mouth daily., Starting 02/05/2014, Until Discontinued, Normal    methocarbamol (ROBAXIN) 500 MG tablet Take 1 tablet (500 mg total) by mouth 3 (three) times daily., Starting 02/05/2014, Until Discontinued, Normal        2.  Patient Education/Counseling:  The patient was given appropriate handouts, self care instructions, and instructed in symptomatic relief.    3.  Follow up:  The patient was told to follow up here if no better in 3 to 4 days, or sooner if becoming worse in any way, and give an an some red flag symptoms such as worsening pain, shortness of breath, dizziness, or passing out which would prompt immediate return.      Reuben Likesavid C Cheyenne Bordeaux, MD 02/05/14 2158

## 2014-02-05 NOTE — Discharge Instructions (Signed)

## 2016-01-05 IMAGING — CR DG ABDOMEN 2V
2 series · 2 of 2 positions shown · non-contrast
Comparison: Abdominal radiograph November 19, 2009

CLINICAL DATA: Abdominal pain and constipation.

EXAM:
ABDOMEN - 2 VIEW

[w abdomen upright]
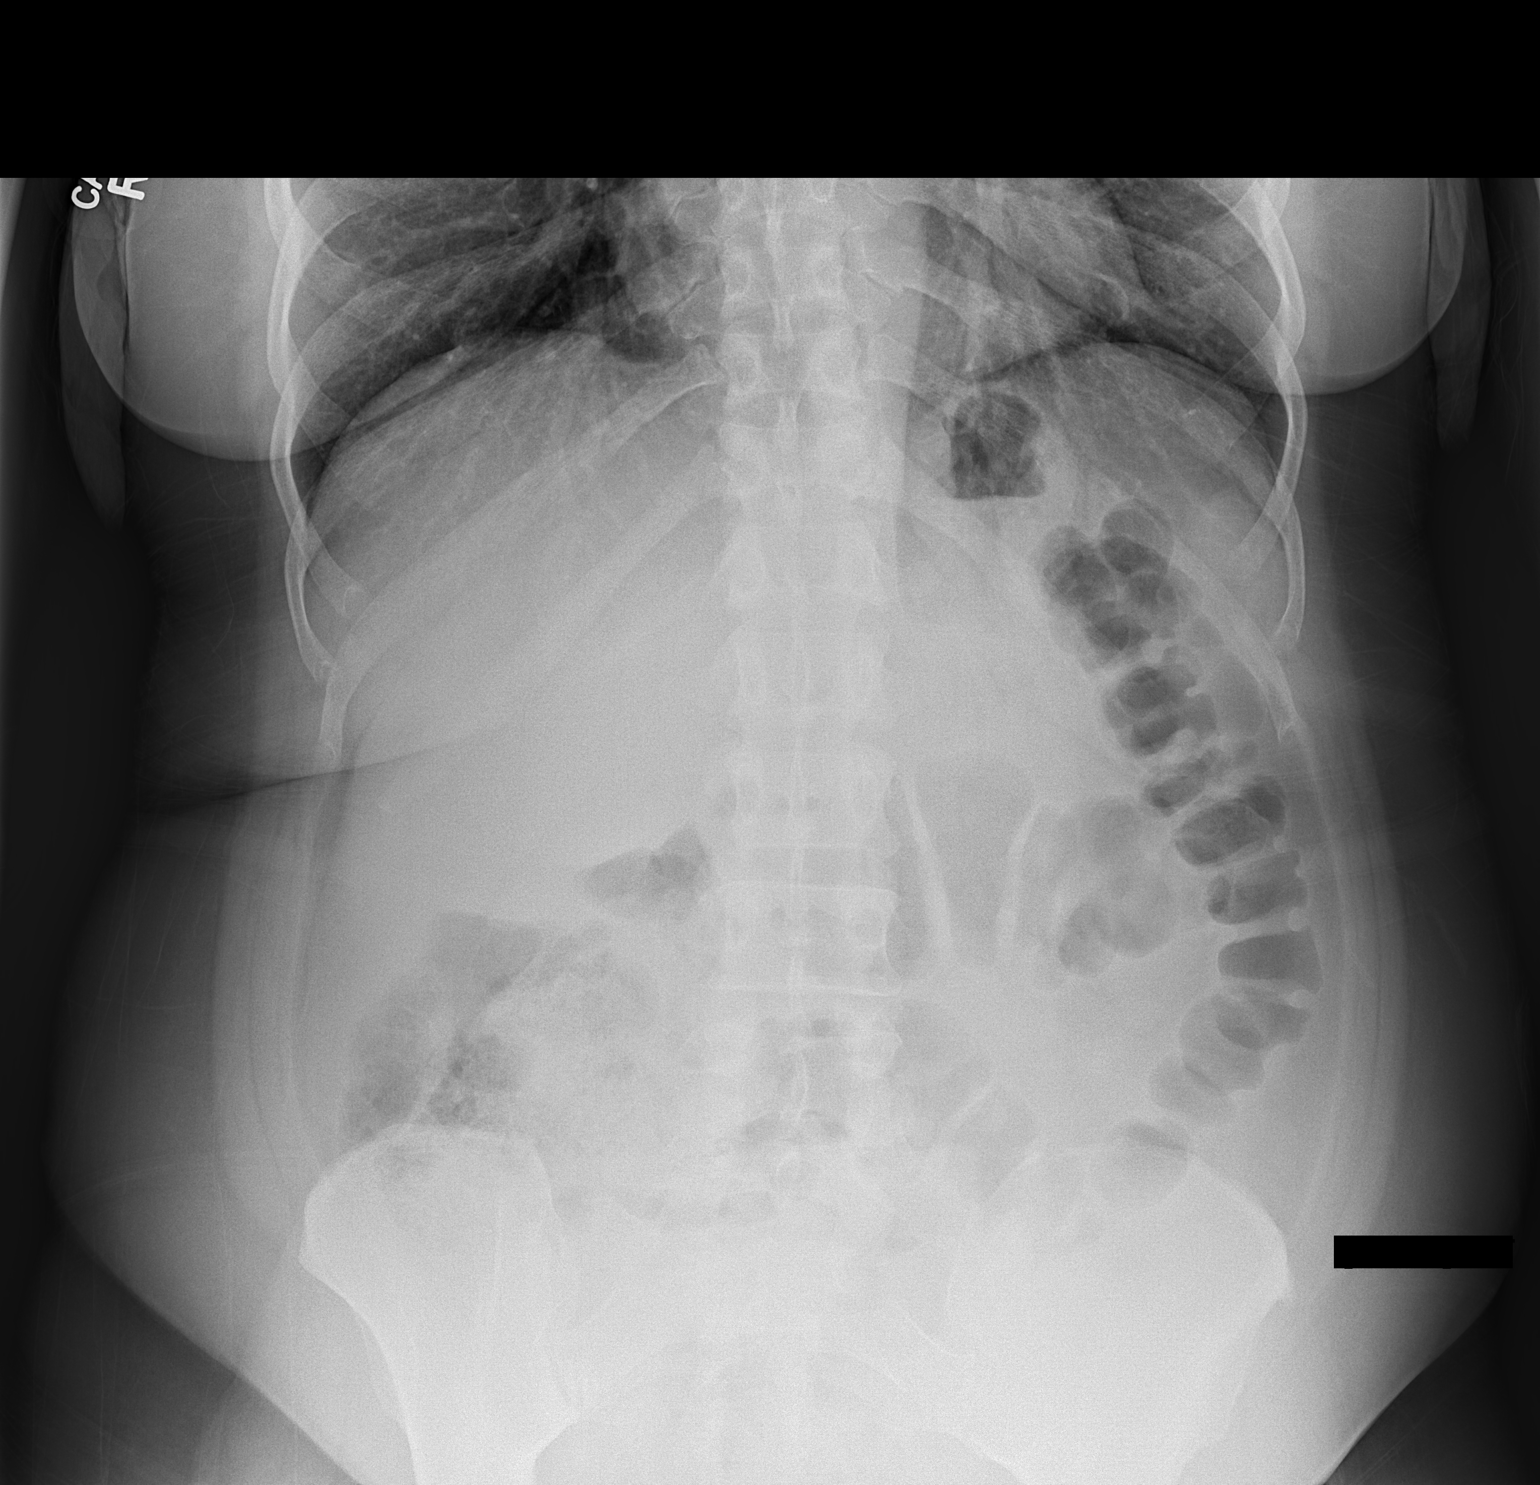

[t abdomen supine]
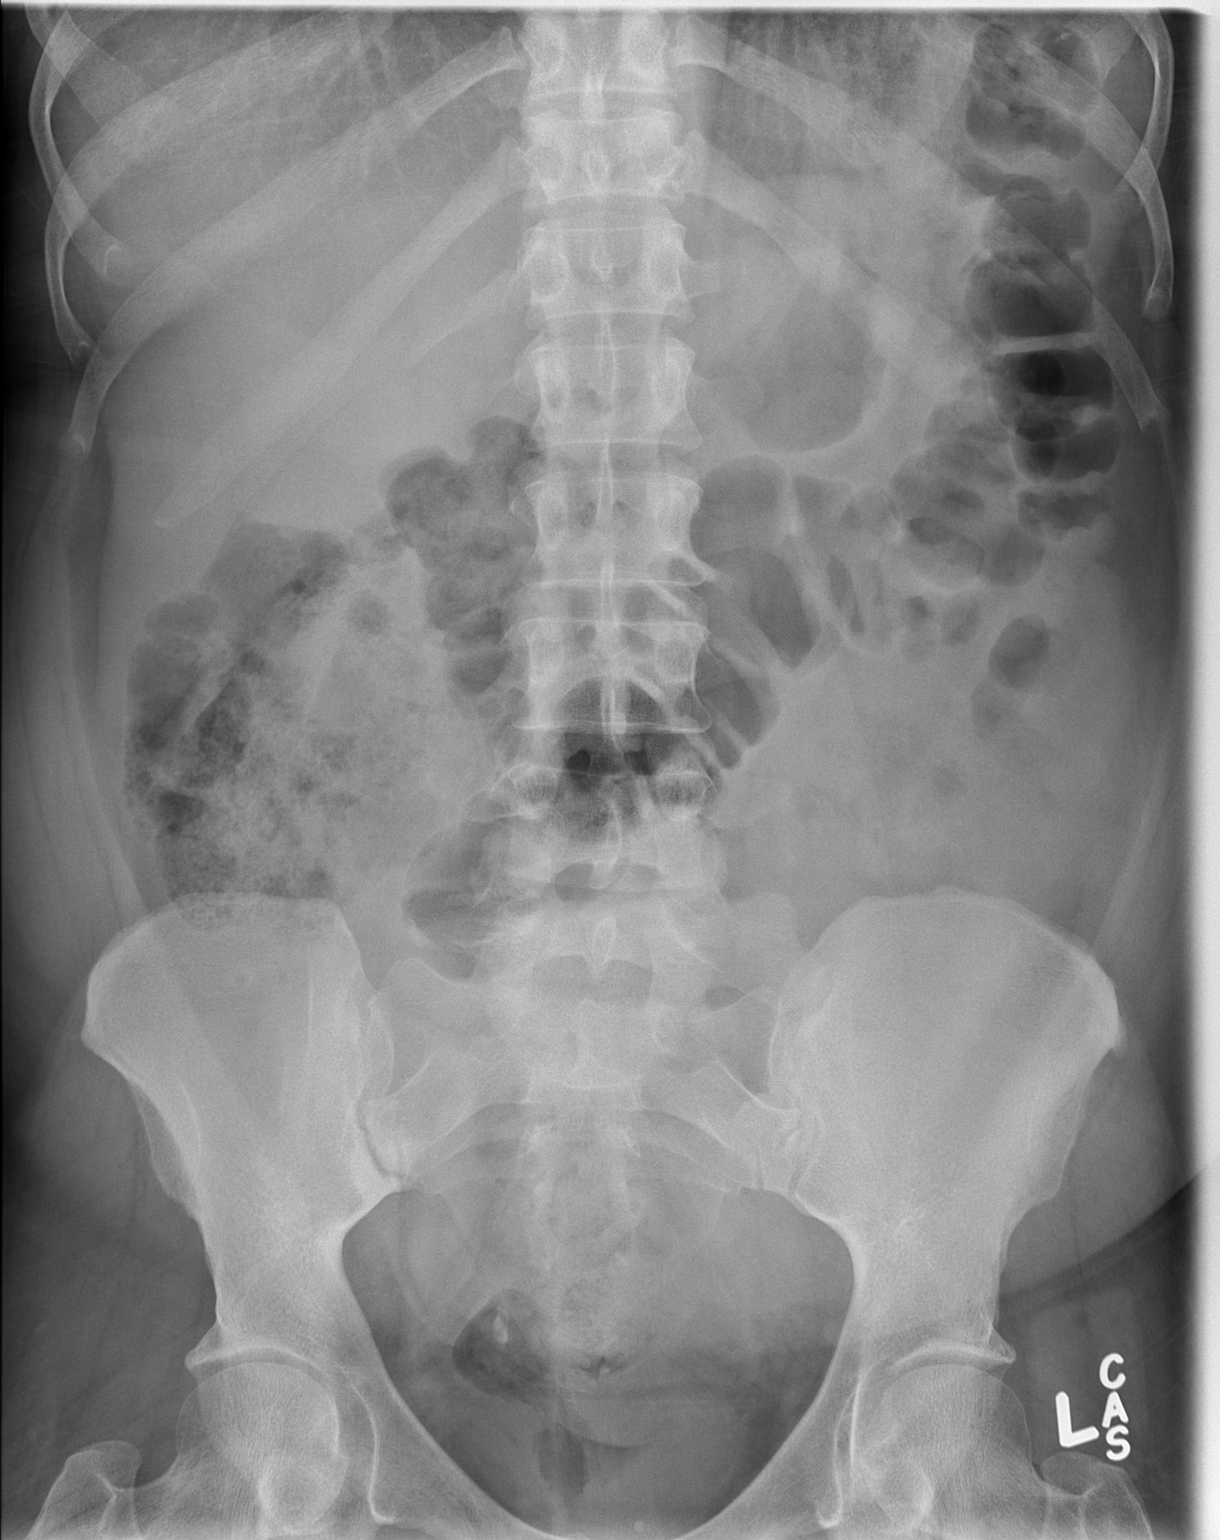

[2 of 2 positions shown; findings below may reference images not displayed]

FINDINGS: The bowel gas pattern is normal. Mild amount of stool projecting at
the cecum. There is no evidence of free air. Phleboliths in the
pelvis. No suspicious radio-opaque calculi or other significant
radiographic abnormality is seen.
IMPRESSION: Mild amount of retained large bowel stool, nonobstructive bowel gas
pattern.

  By: Gian Lorenzo Nelu

## 2016-08-25 ENCOUNTER — Encounter (HOSPITAL_COMMUNITY): Payer: Self-pay | Admitting: Emergency Medicine

## 2016-08-25 ENCOUNTER — Ambulatory Visit (HOSPITAL_COMMUNITY)
Admission: EM | Admit: 2016-08-25 | Discharge: 2016-08-25 | Disposition: A | Discharge status: Home / Self Care | Payer: Medicaid Other | Attending: Family Medicine | Admitting: Family Medicine

## 2016-08-25 DIAGNOSIS — K0889 Other specified disorders of teeth and supporting structures: Secondary | ICD-10-CM

## 2016-08-25 MED ORDER — BUPIVACAINE-EPINEPHRINE (PF) 0.5% -1:200000 IJ SOLN
INTRAMUSCULAR | Status: AC
  Filled 2016-08-25: qty 1.8

## 2016-08-25 NOTE — ED Triage Notes (Signed)
[facility] with a complaint of dental pain x 2 days. The patient reported that she went to the dentist today but did not have medicaid dental coverage so nothing was done.

## 2016-08-25 NOTE — ED Provider Notes (Signed)
CSN: [id number]     Arrival date & time 08/25/16  1814 History   First MD Initiated Contact with Patient 08/25/16 1851     Chief Complaint  Patient presents with  . Dental Pain   (Consider location/radiation/quality/duration/timing/severity/associated sxs/prior Treatment) Patient c/o right lower back molar pain.  She states it hurts to eat.  She went to see dentist today but her medicaid was not working and she was not seen.   The history is provided by the patient.  Dental Pain  Location:  Lower Quality:  Aching Severity:  Severe Onset quality:  Sudden Duration:  2 days Timing:  Constant Progression:  Worsening Chronicity:  New Context: dental caries     Past Medical History:  Diagnosis Date  . Bronchitis    Past Surgical History:  Procedure Laterality Date  . TUBAL LIGATION     Family History  Problem Relation Age of Onset  . Allergies Sister   . Heart disease Paternal Grandmother    Social History  Substance Use Topics  . Smoking status: Current Every Day Smoker    Packs/day: 0.25    Years: 11.00    Types: Cigarettes    Last attempt to quit: 06/22/2010  . Smokeless tobacco: Not on file  . Alcohol use No   OB History    No data available     Review of Systems  Constitutional: Negative.   HENT: Negative.   Eyes: Negative.   Respiratory: Negative.   Cardiovascular: Negative.   Gastrointestinal: Negative.   Endocrine: Negative.   Genitourinary: Negative.   Musculoskeletal: Negative.   Allergic/Immunologic: Negative.   Neurological: Negative.   Hematological: Negative.   Psychiatric/Behavioral: Negative.     Allergies  Patient has no known allergies.  Home Medications   Prior to Admission medications   Medication Sig Start Date End Date Taking? Authorizing Provider  ibuprofen (ADVIL,MOTRIN) 600 MG tablet Take 1 tablet (600 mg total) by mouth every 6 (six) hours as needed. 01/04/14  Yes Renne CriglerJoshua Geiple, PA-C   Meds Ordered and Administered this Visit   Medications - No data to display  BP 158/93 (BP Location: Right Arm)   Pulse 94   Temp 99.1 F (37.3 C) (Oral)   Resp 14   SpO2 99%  No data found.   Physical Exam  Constitutional: She appears well-developed and well-nourished.  HENT:  Head: Normocephalic and atraumatic.  TTP right last lower molar with percuss.  Eyes: EOM are normal. Pupils are equal, round, and reactive to light.  Neck: Normal range of motion. Neck supple.  Cardiovascular: Normal rate, regular rhythm and normal heart sounds.   Pulmonary/Chest: Effort normal and breath sounds normal.  Abdominal: Soft. Bowel sounds are normal.  Nursing note and vitals reviewed.   Urgent Care Course     Dental Block Date/Time: 08/25/2016 8:27 PM Performed by: Deatra CanterXFORD, Saroya Riccobono J Authorized by: Deatra CanterXFORD, Markeda Narvaez J   Consent:    Consent obtained:  Verbal   Consent given by:  Patient   Risks discussed:  Allergic reaction, hematoma, intravascular injection, pain, nerve damage, infection, swelling and unsuccessful block   Alternatives discussed:  No treatment Indications:    Indications: dental pain   Location:    Block type:  Inferior alveolar   Laterality:  Right Procedure details (see MAR for exact dosages):    Syringe type:  Aspirating dental syringe   Needle gauge:  25 G   Anesthetic injected:  Bupivacaine 0.25% WITH epi   Injection procedure:  Anatomic landmarks  identified Post-procedure details:    Outcome:  Anesthesia achieved   Patient tolerance of procedure:  Tolerated well, no immediate complications   (including critical care time)  Labs Review Labs Reviewed - No data to display  Imaging Review No results found.   Visual Acuity Review  Right Eye Distance:   Left Eye Distance:   Bilateral Distance:    Right Eye Near:   Left Eye Near:    Bilateral Near:         MDM   1. Pain, dental    Dental block using bupivacaine and patient expresses immediate relief.      Deatra Canter,  FNP 08/25/16 2028

## 2016-08-25 NOTE — Discharge Instructions (Signed)
Please follow up with your dentist.

## 2016-10-07 ENCOUNTER — Ambulatory Visit (HOSPITAL_COMMUNITY)
Admission: EM | Admit: 2016-10-07 | Discharge: 2016-10-07 | Disposition: A | Discharge status: Home / Self Care | Payer: Medicaid Other | Attending: Family Medicine | Admitting: Family Medicine

## 2016-10-07 ENCOUNTER — Encounter (HOSPITAL_COMMUNITY): Payer: Self-pay | Admitting: Emergency Medicine

## 2016-10-07 DIAGNOSIS — B3731 Acute candidiasis of vulva and vagina: Secondary | ICD-10-CM

## 2016-10-07 DIAGNOSIS — B373 Candidiasis of vulva and vagina: Secondary | ICD-10-CM | POA: Diagnosis not present

## 2016-10-07 DIAGNOSIS — R21 Rash and other nonspecific skin eruption: Secondary | ICD-10-CM

## 2016-10-07 MED ORDER — NYSTATIN 100000 UNIT/GM EX CREA: TOPICAL_CREAM | CUTANEOUS | 1 refills | Status: DC

## 2016-10-07 MED ORDER — FLUCONAZOLE 150 MG PO TABS: 150.0000 mg | ORAL_TABLET | Freq: Every day | ORAL | 3 refills | Status: DC

## 2016-10-07 MED ORDER — MONISTAT 3 COMBINATION PACK 200-2 MG-% VA KIT: 1.0000 | PACK | Freq: Every day | VAGINAL | 1 refills | Status: DC

## 2016-10-07 NOTE — ED Provider Notes (Signed)
CSN: [id number]     Arrival date & time 10/07/16  6659 History   None    Chief Complaint  Patient presents with  . Vaginitis   (Consider location/radiation/quality/duration/timing/severity/associated sxs/prior Treatment) Patient c/o vaginal itching    Vaginal Itching  This is a new problem. The problem occurs constantly. The problem has not changed since onset.Nothing aggravates the symptoms.    Past Medical History:  Diagnosis Date  . Bronchitis    Past Surgical History:  Procedure Laterality Date  . TUBAL LIGATION     Family History  Problem Relation Age of Onset  . Allergies Sister   . Heart disease Paternal Grandmother    Social History  Substance Use Topics  . Smoking status: Current Every Day Smoker    Packs/day: 0.25    Years: 11.00    Types: Cigarettes    Last attempt to quit: 06/22/2010  . Smokeless tobacco: Current User  . Alcohol use No   OB History    No data available     Review of Systems  Constitutional: Negative.   HENT: Negative.   Eyes: Negative.   Respiratory: Negative.   Cardiovascular: Negative.   Gastrointestinal: Negative.   Endocrine: Negative.   Genitourinary: Positive for vaginal discharge.  Allergic/Immunologic: Negative.   Neurological: Negative.   Hematological: Negative.   Psychiatric/Behavioral: Negative.     Allergies  Patient has no known allergies.  Home Medications   Prior to Admission medications   Medication Sig Start Date End Date Taking? Authorizing Provider  fluconazole (DIFLUCAN) 150 MG tablet Take 1 tablet (150 mg total) by mouth daily. 10/07/16   Lysbeth Penner, FNP  ibuprofen (ADVIL,MOTRIN) 600 MG tablet Take 1 tablet (600 mg total) by mouth every 6 (six) hours as needed. 01/04/14   Carlisle Cater, PA-C  Miconazole Nitrate-Wipes (MONISTAT 3 COMBINATION PACK) 200-2 MG-% KIT Place 1 applicator vaginally at bedtime. 10/07/16   Lysbeth Penner, FNP  nystatin cream (MYCOSTATIN) Apply to affected area 2 times daily  10/07/16   Lysbeth Penner, FNP   Meds Ordered and Administered this Visit  Medications - No data to display  BP (!) 176/76 (BP Location: Right Arm)   Pulse 93   Temp 98.6 F (37 C) (Oral)   Resp 17   Ht 5' 7" (1.702 m)   Wt 200 lb (90.7 kg)   LMP 09/20/2016   SpO2 100%   BMI 31.32 kg/m  No data found.   Physical Exam  Constitutional: She appears well-developed and well-nourished.  HENT:  Head: Normocephalic and atraumatic.  Eyes: Conjunctivae and EOM are normal. Pupils are equal, round, and reactive to light.  Neck: Normal range of motion. Neck supple.  Cardiovascular: Normal rate, regular rhythm and normal heart sounds.   Pulmonary/Chest: Effort normal and breath sounds normal.  Abdominal: Soft. Bowel sounds are normal.  Genitourinary:  Genitourinary Comments: Vulva and vaginal introitus with white curdish DC  Nursing note and vitals reviewed.   Urgent Care Course     Procedures (including critical care time)  Labs Review Labs Reviewed - No data to display  Imaging Review No results found.   Visual Acuity Review  Right Eye Distance:   Left Eye Distance:   Bilateral Distance:    Right Eye Near:   Left Eye Near:    Bilateral Near:         MDM   1. Vaginal candidiasis   2. Rash    Diflucan 176m one po qd #3 Monistat cream  Lysbeth Penner, FNP 10/07/16 2053    Lysbeth Penner, FNP 10/15/16 [id number]

## 2016-10-07 NOTE — ED Triage Notes (Signed)
Pt. Stated, Mariah Ray been on an antibiotic and I've got some irritation and burning on my vagina.

## 2023-01-07 ENCOUNTER — Encounter (HOSPITAL_COMMUNITY): Payer: Self-pay

## 2023-01-07 ENCOUNTER — Other Ambulatory Visit: Payer: Self-pay

## 2023-01-07 ENCOUNTER — Emergency Department (HOSPITAL_COMMUNITY)
Admission: EM | Admit: 2023-01-07 | Discharge: 2023-01-07 | Disposition: A | Discharge status: Home / Self Care | Payer: Medicaid Other | Attending: Emergency Medicine | Admitting: Emergency Medicine

## 2023-01-07 DIAGNOSIS — R350 Frequency of micturition: Secondary | ICD-10-CM | POA: Insufficient documentation

## 2023-01-07 DIAGNOSIS — R3911 Hesitancy of micturition: Secondary | ICD-10-CM | POA: Diagnosis not present

## 2023-01-07 DIAGNOSIS — R319 Hematuria, unspecified: Secondary | ICD-10-CM | POA: Diagnosis not present

## 2023-01-07 DIAGNOSIS — R300 Dysuria: Secondary | ICD-10-CM | POA: Insufficient documentation

## 2023-01-07 DIAGNOSIS — N390 Urinary tract infection, site not specified: Secondary | ICD-10-CM

## 2023-01-07 DIAGNOSIS — M549 Dorsalgia, unspecified: Secondary | ICD-10-CM | POA: Insufficient documentation

## 2023-01-07 LAB — CBC
HCT: 33.5 % — ABNORMAL LOW (ref 36.0–46.0)
Hemoglobin: 9.9 g/dL — ABNORMAL LOW (ref 12.0–15.0)
MCH: 23.1 pg — ABNORMAL LOW (ref 26.0–34.0)
MCHC: 29.6 g/dL — ABNORMAL LOW (ref 30.0–36.0)
MCV: 78.1 fL — ABNORMAL LOW (ref 80.0–100.0)
Platelets: 437 10*3/uL — ABNORMAL HIGH (ref 150–400)
RBC: 4.29 MIL/uL (ref 3.87–5.11)
RDW: 16.4 % — ABNORMAL HIGH (ref 11.5–15.5)
WBC: 10.4 10*3/uL (ref 4.0–10.5)
nRBC: 0 % (ref 0.0–0.2)

## 2023-01-07 LAB — URINALYSIS, ROUTINE W REFLEX MICROSCOPIC
Glucose, UA: NEGATIVE mg/dL
Ketones, ur: NEGATIVE mg/dL
Nitrite: POSITIVE — AB
Protein, ur: 30 mg/dL — AB
Specific Gravity, Urine: >1.03 — ABNORMAL HIGH (ref 1.005–1.030)
pH: 6 (ref 5.0–8.0)

## 2023-01-07 LAB — BASIC METABOLIC PANEL
Anion gap: 6 (ref 5–15)
BUN: 8 mg/dL (ref 6–20)
CO2: 23 mmol/L (ref 22–32)
Calcium: 8.3 mg/dL — ABNORMAL LOW (ref 8.9–10.3)
Chloride: 106 mmol/L (ref 98–111)
Creatinine, Ser: 0.79 mg/dL (ref 0.44–1.00)
GFR, Estimated: >60 mL/min (ref >60)
Glucose, Bld: 117 mg/dL — ABNORMAL HIGH (ref 70–99)
Potassium: 3.1 mmol/L — ABNORMAL LOW (ref 3.5–5.1)
Sodium: 135 mmol/L (ref 135–145)

## 2023-01-07 LAB — URINALYSIS, MICROSCOPIC (REFLEX)

## 2023-01-07 LAB — HCG, SERUM, QUALITATIVE: Preg, Serum: NEGATIVE

## 2023-01-07 MED ORDER — CEPHALEXIN 250 MG PO CAPS
500.0000 mg | ORAL_CAPSULE | Freq: Once | ORAL | Status: AC
  Administered 2023-01-07: 500 mg via ORAL
  Filled 2023-01-07: qty 2

## 2023-01-07 MED ORDER — CEPHALEXIN 500 MG PO CAPS: 500.0000 mg | ORAL_CAPSULE | Freq: Two times a day (BID) | ORAL | 0 refills | Status: AC

## 2023-01-07 MED ORDER — POTASSIUM CHLORIDE CRYS ER 20 MEQ PO TBCR
40.0000 meq | EXTENDED_RELEASE_TABLET | Freq: Once | ORAL | Status: AC
  Administered 2023-01-07: 40 meq via ORAL
  Filled 2023-01-07: qty 2

## 2023-01-07 NOTE — ED Triage Notes (Signed)
Says she's been having dysuria, urinary frequency, and foul odor x 3 days.   Also says she's been having some upper back pain for several days. 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

## 2023-01-07 NOTE — ED Provider Notes (Signed)
[facility] AT [facility] Provider Note   CSN: [id number] Arrival date & time: 01/07/23  0115     History  Chief Complaint  Patient presents with   Dysuria   Back Pain    Mariah Ray is a 47 y.o. female.  Patient comes in with 3 days of dysuria, urinary hesitancy, urinary frequency.  Patient also reports a little blood in her urine, and foul odor. Patient notes that she has been regularly sexually active, with her partner, but does not pee after sex.  Patient reports a remote history of UTIs when she was younger.  Reports her last UTI was a year ago.  Patient reports this feels like a UTI.  Patient denies any fevers, nausea, vomiting, diarrhea, malaise.  Patient also complaining of back pain, that started this morning.  Patient notes pain is sharp, and only feels it when she moves/twist torso, or takes a deep breath in/out.  Patient denies any shortness of breath, or cough.  Patient otherwise feeling normal state of health.  The history is provided by the patient.  Dysuria Pain quality:  Sharp Pain severity:  Moderate Onset quality:  Sudden Chronicity:  New Recent urinary tract infections: no   Urinary symptoms: foul-smelling urine, frequent urination, hematuria and hesitancy   Associated symptoms: no abdominal pain, no fever, no flank pain, no nausea, no vaginal discharge and no vomiting   Risk factors: sexually active   Back Pain Associated symptoms: dysuria   Associated symptoms: no abdominal pain and no fever        Home Medications Prior to Admission medications   Medication Sig Start Date End Date Taking? Authorizing Provider  fluconazole (DIFLUCAN) 150 MG tablet Take 1 tablet (150 mg total) by mouth daily. 10/07/16   Deatra Canter, FNP  ibuprofen (ADVIL,MOTRIN) 600 MG tablet Take 1 tablet (600 mg total) by mouth every 6 (six) hours as needed. 01/04/14   Renne Crigler, PA-C  Miconazole Nitrate-Wipes (MONISTAT 3 COMBINATION PACK)  200-2 MG-% KIT Place 1 applicator vaginally at bedtime. 10/07/16   Deatra Canter, FNP  nystatin cream (MYCOSTATIN) Apply to affected area 2 times daily 10/07/16   Deatra Canter, FNP      Allergies    Patient has no known allergies.    Review of Systems   Review of Systems  Constitutional:  Negative for fever.  Gastrointestinal:  Negative for abdominal pain, nausea and vomiting.  Genitourinary:  Positive for dysuria and hematuria. Negative for flank pain and vaginal discharge.  Musculoskeletal:  Positive for back pain. Negative for myalgias.    Physical Exam Updated Vital Signs BP (!) 152/79   Pulse (!) 101   Temp 98.6 F (37 C)   Resp 17   Ht 5\' 7"  (1.702 m)   Wt 90.3 kg   LMP  (LMP Unknown)   SpO2 100%   BMI 31.17 kg/m  Physical Exam Constitutional:      General: She is not in acute distress.    Appearance: Normal appearance. She is not ill-appearing.  Cardiovascular:     Rate and Rhythm: Normal rate and regular rhythm.     Pulses: Normal pulses.     Heart sounds: Normal heart sounds. No murmur heard.    No friction rub. No gallop.  Pulmonary:     Effort: Pulmonary effort is normal. No respiratory distress.     Breath sounds: Normal breath sounds. No stridor. No wheezing, rhonchi or rales.  Abdominal:  General: There is no distension.     Palpations: Abdomen is soft. There is no mass.     Tenderness: There is no abdominal tenderness. There is no right CVA tenderness or left CVA tenderness.  Musculoskeletal:     Thoracic back: No tenderness or bony tenderness.  Neurological:     Mental Status: She is alert.     ED Results / Procedures / Treatments   Labs (all labs ordered are listed, but only abnormal results are displayed) Labs Reviewed  CBC - Abnormal; Notable for the following components:      Result Value   Hemoglobin 9.9 (*)    HCT 33.5 (*)    MCV 78.1 (*)    MCH 23.1 (*)    MCHC 29.6 (*)    RDW 16.4 (*)    Platelets 437 (*)    All other  components within normal limits  BASIC METABOLIC PANEL - Abnormal; Notable for the following components:   Potassium 3.1 (*)    Glucose, Bld 117 (*)    Calcium 8.3 (*)    All other components within normal limits  URINALYSIS, ROUTINE W REFLEX MICROSCOPIC - Abnormal; Notable for the following components:   APPearance CLOUDY (*)    Specific Gravity, Urine >1.030 (*)    Hgb urine dipstick MODERATE (*)    Bilirubin Urine SMALL (*)    Protein, ur 30 (*)    Nitrite POSITIVE (*)    Leukocytes,Ua TRACE (*)    All other components within normal limits  URINALYSIS, MICROSCOPIC (REFLEX) - Abnormal; Notable for the following components:   Bacteria, UA MANY (*)    All other components within normal limits  HCG, SERUM, QUALITATIVE    EKG None  Radiology No results found.  Procedures Procedures    Medications Ordered in ED Medications - No data to display  ED Course/ Medical Decision Making/ A&P                             Medical Decision Making Patient comes in for concern of UTI.  Patient notes history of UTI, and feels like this is UTI.  Patient has dysuria, urinary frequency, urinary hesitancy, is also appreciating little blood in urine, last 3 days.  Patient denies any systemic symptoms, or allergies to medication.  Patient UA positive for nitrites, leukocytes, bacteria, and moderate hemoglobin.  Will treat patient for UTI.  Will recommend Keflex, 500 mg twice daily, x 7 days.   Patient also complained of back pain, related to wearing up with back that is heavy.  Pain noted when patient moves/twist body, or takes deep breath in and out.  Patient denies any systemic symptoms, and is otherwise feeling her normal state of health.  Physical exam without tenderness to palpation however patient localizes pain deep to right shoulder blade.  Patient's vitals are stable, and denies any shortness of breath or cough, low suspicion for pneumonia, PE, dissection.  Patient most likely suffering  from musculoskeletal complaint will recommend ibuprofen as needed.  Patient also noted to have some microcytic anemia, concerning for iron deficiency anemia. Patient should seek out/follow up with primary care provider.  Amount and/or Complexity of Data Reviewed Labs: ordered.    Details: CBC, BMP, UA, UMicroscopy  Risk Prescription drug management.          Final Clinical Impression(s) / ED Diagnoses Final diagnoses:  None    Rx / DC Orders ED Discharge Orders  None         Bess Kinds, MD 01/07/23 1610    Cathren Laine, MD 01/07/23 1336    Cathren Laine, MD 01/07/23 1340

## 2023-01-07 NOTE — Discharge Instructions (Addendum)
Today you were seen in the ED for complaints of increased urinary frequency, pain with urination, and diagnosed with a urinary tract infection (UTI).  We are going to treat you with antibiotics. This likely came from not urinating after having sex. Be sure to drink plenty of water daily, to flush out urine, and to urinate after sex.  -Antibiotics: Take Keflex 500 mg twice a day, for 7 days  Your back pain seems to be muscular in nature, we have low concern for, issue with your lungs, heart, or bones in your back.  This is likely a muscular strain of your back.  This can best be treated with ibuprofen.  If pain persist and is not treated with ibuprofen, speak with your primary care provider. -Take ibuprofen 600 mg every 6 hours as needed - Be sure to eat when taking ibuprofen -Can also try Voltaren Gel or Lidocaine Patches (Salonpas), both available over the counter in most pharmacies. Lidocaine patches can be worn for 12 hours on, 12 hours off, and use Voltaren gel when not using lidocaine patches  You are also noted to to be anemic, or have low hemoglobin/blood levels.  Your levels were not dangerously low, but were low. Your hemoglobin was 9, with normal range being around 12.  This can best be managed with your primary care provider.    If you develop significant bleeding, fatigue, weakness, shortness of breath please seek immediate emergency medical care.

## 2024-01-25 ENCOUNTER — Encounter (HOSPITAL_COMMUNITY): Payer: Self-pay

## 2024-01-25 ENCOUNTER — Ambulatory Visit (HOSPITAL_COMMUNITY)
Admission: EM | Admit: 2024-01-25 | Discharge: 2024-01-25 | Disposition: A | Discharge status: Home / Self Care | Payer: MEDICAID | Attending: Family Medicine | Admitting: Family Medicine

## 2024-01-25 DIAGNOSIS — R300 Dysuria: Secondary | ICD-10-CM | POA: Insufficient documentation

## 2024-01-25 DIAGNOSIS — N760 Acute vaginitis: Secondary | ICD-10-CM | POA: Diagnosis present

## 2024-01-25 LAB — POCT URINALYSIS DIP (MANUAL ENTRY)
Bilirubin, UA: NEGATIVE
Blood, UA: NEGATIVE
Glucose, UA: NEGATIVE mg/dL
Ketones, POC UA: NEGATIVE mg/dL
Leukocytes, UA: NEGATIVE
Nitrite, UA: NEGATIVE
Protein Ur, POC: NEGATIVE mg/dL
Spec Grav, UA: 1.015 (ref 1.010–1.025)
Urobilinogen, UA: 2 U/dL — AB
pH, UA: 7 (ref 5.0–8.0)

## 2024-01-25 MED ORDER — METRONIDAZOLE 500 MG PO TABS: 500.0000 mg | ORAL_TABLET | Freq: Two times a day (BID) | ORAL | 0 refills | Status: AC

## 2024-01-25 NOTE — ED Provider Notes (Signed)
 [facility]    CSN: [id number] Arrival date & time: 01/25/24  1617      History   Chief Complaint Chief Complaint  Patient presents with   Urinary Frequency   Mass    Right side of jaw    HPI Mariah Ray is a 48 y.o. female.   Here for some urinary and vaginal odor and some burning of her perineum when she is urinating.  She actually does not have any urinary frequency or incomplete bladder emptying.  She was treated with vaginal gel about a month ago and then the symptoms dissipated just a little bit and then got worse again.  No fever or chills no nausea or vomiting.  Maybe some discharge and a little bit of itching is also present.  NKDA  Last menstrual cycle was July 20  She also notes some feeling of some knots right under both sides of her jaw that move around.  She also states her mom has a history of thyroid gland enlargement.    Past Medical History:  Diagnosis Date   Bronchitis     Patient Active Problem List   Diagnosis Date Noted   Other sleep disturbances 04/21/2011    Past Surgical History:  Procedure Laterality Date   TUBAL LIGATION      OB History   No obstetric history on file.      Home Medications    Prior to Admission medications   Medication Sig Start Date End Date Taking? Authorizing Provider  metroNIDAZOLE  (FLAGYL ) 500 MG tablet Take 1 tablet (500 mg total) by mouth 2 (two) times daily for 7 days. 01/25/24 02/01/24 Yes Tapanga [place], Sharlet POUR, MD    Family History Family History  Problem Relation Age of Onset   Allergies Sister    Heart disease Paternal Grandmother     Social History Social History   Tobacco Use   Smoking status: Former    Current packs/day: 0.00    Average packs/day: 0.3 packs/day for 11.0 years (2.8 ttl pk-yrs)    Types: Cigarettes    Start date: 06/23/1999    Quit date: 06/22/2010    Years since quitting: 13.6   Smokeless tobacco: Current  Vaping Use   Vaping status: Never Used  Substance  Use Topics   Alcohol use: No   Drug use: No     Allergies   Patient has no known allergies.   Review of Systems Review of Systems  Genitourinary:  Positive for frequency.     Physical Exam Triage Vital Signs ED Triage Vitals [01/25/24 1736]  Encounter Vitals Group     BP 130/71     Girls Systolic BP Percentile      Girls Diastolic BP Percentile      Boys Systolic BP Percentile      Boys Diastolic BP Percentile      Pulse Rate 77     Resp 16     Temp (!) 97.5 F (36.4 C)     Temp Source Oral     SpO2 99 %     Weight      Height      Head Circumference      Peak Flow      Pain Score 0     Pain Loc      Pain Education      Exclude from Growth Chart    No data found.  Updated Vital Signs BP 130/71 (BP Location: Left Arm)   Pulse 77  Temp (!) 97.5 F (36.4 C) (Oral)   Resp 16   LMP 01/09/2024 (Exact Date)   SpO2 99%   Visual Acuity Right Eye Distance:   Left Eye Distance:   Bilateral Distance:    Right Eye Near:   Left Eye Near:    Bilateral Near:     Physical Exam Vitals reviewed.  Constitutional:      General: She is not in acute distress.    Appearance: She is not ill-appearing, toxic-appearing or diaphoretic.  HENT:     Mouth/Throat:     Mouth: Mucous membranes are moist.  Eyes:     Extraocular Movements: Extraocular movements intact.     Conjunctiva/sclera: Conjunctivae normal.     Pupils: Pupils are equal, round, and reactive to light.  Neck:     Comments: I can feel some tiny subcu nodules in her submandibular areas bilaterally that I think are lymph nodes that are tiny.  Her thyroid gland does appear enlarged but is nontender.  It is firm and not hard. Musculoskeletal:     Cervical back: Neck supple.  Skin:    Coloration: Skin is not pale.  Neurological:     General: No focal deficit present.     Mental Status: She is alert and oriented to person, place, and time.  Psychiatric:        Behavior: Behavior normal.      UC  Treatments / Results  Labs (all labs ordered are listed, but only abnormal results are displayed) Labs Reviewed  POCT URINALYSIS DIP (MANUAL ENTRY) - Abnormal; Notable for the following components:      Result Value   Clarity, UA cloudy (*)    Urobilinogen, UA 2.0 (*)    All other components within normal limits  CERVICOVAGINAL ANCILLARY ONLY    EKG   Radiology No results found.  Procedures Procedures (including critical care time)  Medications Ordered in UC Medications - No data to display  Initial Impression / Assessment and Plan / UC Course  I have reviewed the triage vital signs and the nursing notes.  Pertinent labs & imaging results that were available during my care of the patient were reviewed by me and considered in my medical decision making (see chart for details).     Urinalysis is cloudy but does not show any nitrites, white blood cells or red blood cells.  Vaginal self swab is done, and we will notify of any positives on that and treat per protocol.  Metronidazole  was sent in to treat empirically for BV.  I have asked her to discuss her recurrent BV with her OB/GYN specialist.  I have asked her to follow-up with her primary care about the enlarged thyroid gland.  She is in agreement and will do so.    Final Clinical Impressions(s) / UC Diagnoses   Final diagnoses:  Acute vaginitis  Dysuria     Discharge Instructions      The urinalysis does not show any sign of infection in the urine.  Staff will notify you if there is anything positive on the swab (or on the blood work if that has been taken at this visit). It can take 2-3 days for the tests to result, depending on the day of the week your test was taken. You will only be notified if there are any positives on the testing; test results will also go to your MyChart if you are signed up for MyChart.   Take metronidazole  500 mg--1 tablet 2 times  daily for 7 days.  Avoid drinking alcohol within 72  hours of taking this medication      ED Prescriptions     Medication Sig Dispense Auth. Provider   metroNIDAZOLE  (FLAGYL ) 500 MG tablet Take 1 tablet (500 mg total) by mouth 2 (two) times daily for 7 days. 14 tablet Donnelle Rubey K, MD      PDMP not reviewed this encounter.   Vonna Sharlet POUR, MD 01/25/24 [phone]

## 2024-01-25 NOTE — Discharge Instructions (Signed)
 The urinalysis does not show any sign of infection in the urine.  Staff will notify you if there is anything positive on the swab (or on the blood work if that has been taken at this visit). It can take 2-3 days for the tests to result, depending on the day of the week your test was taken. You will only be notified if there are any positives on the testing; test results will also go to your MyChart if you are signed up for MyChart.   Take metronidazole  500 mg--1 tablet 2 times daily for 7 days.  Avoid drinking alcohol within 72 hours of taking this medication

## 2024-01-25 NOTE — ED Triage Notes (Signed)
 Patient here today with c/o urinary frequency, odor, itching, and slight stinging in urination X 1 week. Patient states that she was treated for BV a month ago.   Patient also c/o a mass on the right side of her jaw X 1 month. Denies pain.

## 2024-01-27 ENCOUNTER — Ambulatory Visit: Payer: Self-pay

## 2024-01-27 LAB — CERVICOVAGINAL ANCILLARY ONLY
Bacterial Vaginitis (gardnerella): POSITIVE — AB
Candida Glabrata: NEGATIVE
Candida Vaginitis: NEGATIVE
Chlamydia: NEGATIVE
Comment: NEGATIVE
Comment: NEGATIVE
Comment: NEGATIVE
Comment: NEGATIVE
Comment: NEGATIVE
Comment: NORMAL
Neisseria Gonorrhea: NEGATIVE
Trichomonas: NEGATIVE
